# Patient Record
Sex: Male | Born: 1945 | Race: White | Hispanic: No | Marital: Single | Smoking: Former smoker
Health system: Southern US, Community
[De-identification: ages and names within clinical notes are randomized; demographics above are authoritative.]

## PROBLEM LIST (undated history)

## (undated) DIAGNOSIS — J189 Pneumonia, unspecified organism: Secondary | ICD-10-CM

## (undated) DIAGNOSIS — C787 Secondary malignant neoplasm of liver and intrahepatic bile duct: Secondary | ICD-10-CM

## (undated) DIAGNOSIS — I639 Cerebral infarction, unspecified: Secondary | ICD-10-CM

## (undated) DIAGNOSIS — Z950 Presence of cardiac pacemaker: Secondary | ICD-10-CM

## (undated) DIAGNOSIS — C189 Malignant neoplasm of colon, unspecified: Secondary | ICD-10-CM

## (undated) DIAGNOSIS — I10 Essential (primary) hypertension: Secondary | ICD-10-CM

## (undated) HISTORY — PX: COLON SURGERY: SHX602

## (undated) HISTORY — PX: INSERT / REPLACE / REMOVE PACEMAKER: SUR710

---

## 2010-08-03 ENCOUNTER — Inpatient Hospital Stay (HOSPITAL_COMMUNITY)
Admission: EM | Admit: 2010-08-03 | Discharge: 2010-08-05 | DRG: 062 | Disposition: A | Discharge status: Home / Self Care | Payer: Self-pay | Attending: Neurology | Admitting: Neurology

## 2010-08-03 ENCOUNTER — Emergency Department (HOSPITAL_COMMUNITY): Payer: Self-pay

## 2010-08-03 DIAGNOSIS — Z7982 Long term (current) use of aspirin: Secondary | ICD-10-CM

## 2010-08-03 DIAGNOSIS — C189 Malignant neoplasm of colon, unspecified: Secondary | ICD-10-CM | POA: Diagnosis present

## 2010-08-03 DIAGNOSIS — I10 Essential (primary) hypertension: Secondary | ICD-10-CM | POA: Diagnosis present

## 2010-08-03 DIAGNOSIS — C787 Secondary malignant neoplasm of liver and intrahepatic bile duct: Secondary | ICD-10-CM | POA: Diagnosis present

## 2010-08-03 DIAGNOSIS — Z79899 Other long term (current) drug therapy: Secondary | ICD-10-CM

## 2010-08-03 DIAGNOSIS — R4701 Aphasia: Secondary | ICD-10-CM | POA: Diagnosis present

## 2010-08-03 DIAGNOSIS — R1310 Dysphagia, unspecified: Secondary | ICD-10-CM | POA: Diagnosis present

## 2010-08-03 DIAGNOSIS — Z950 Presence of cardiac pacemaker: Secondary | ICD-10-CM

## 2010-08-03 DIAGNOSIS — I6340 Cerebral infarction due to embolism of unspecified cerebral artery: Principal | ICD-10-CM | POA: Diagnosis present

## 2010-08-03 DIAGNOSIS — E785 Hyperlipidemia, unspecified: Secondary | ICD-10-CM | POA: Diagnosis present

## 2010-08-03 DIAGNOSIS — R471 Dysarthria and anarthria: Secondary | ICD-10-CM | POA: Diagnosis present

## 2010-08-03 DIAGNOSIS — Z933 Colostomy status: Secondary | ICD-10-CM

## 2010-08-03 HISTORY — DX: Essential (primary) hypertension: I10

## 2010-08-03 HISTORY — DX: Malignant neoplasm of colon, unspecified: C18.9

## 2010-08-03 HISTORY — DX: Presence of cardiac pacemaker: Z95.0

## 2010-08-03 LAB — COMPREHENSIVE METABOLIC PANEL
AST: 29 U/L (ref 0–37)
BUN: 34 mg/dL — ABNORMAL HIGH (ref 6–23)
CO2: 24 mEq/L (ref 19–32)
Calcium: 9 mg/dL (ref 8.4–10.5)
Creatinine, Ser: 1.94 mg/dL — ABNORMAL HIGH (ref 0.50–1.35)
GFR calc non Af Amer: 35 mL/min — ABNORMAL LOW (ref >60)

## 2010-08-03 LAB — CBC
HCT: 35.2 % — ABNORMAL LOW (ref 39.0–52.0)
Hemoglobin: 11.6 g/dL — ABNORMAL LOW (ref 13.0–17.0)
MCV: 84.8 fL (ref 78.0–100.0)
RBC: 4.15 MIL/uL — ABNORMAL LOW (ref 4.22–5.81)
WBC: 8.3 10*3/uL (ref 4.0–10.5)

## 2010-08-03 LAB — TROPONIN I: Troponin I: 0.36 ng/mL (ref <0.30)

## 2010-08-03 LAB — DIFFERENTIAL
Eosinophils Relative: 0 % (ref 0–5)
Lymphocytes Relative: 9 % — ABNORMAL LOW (ref 12–46)
Monocytes Relative: 18 % — ABNORMAL HIGH (ref 3–12)
Neutrophils Relative %: 73 % (ref 43–77)

## 2010-08-03 LAB — APTT: aPTT: 25 seconds (ref 24–37)

## 2010-08-03 LAB — PROTIME-INR: INR: 1.16 (ref 0.00–1.49)

## 2010-08-03 LAB — POCT I-STAT, CHEM 8
BUN: 34 mg/dL — ABNORMAL HIGH (ref 6–23)
Calcium, Ion: 1.04 mmol/L — ABNORMAL LOW (ref 1.12–1.32)
Chloride: 111 mEq/L (ref 96–112)
Glucose, Bld: 106 mg/dL — ABNORMAL HIGH (ref 70–99)

## 2010-08-03 LAB — CK TOTAL AND CKMB (NOT AT ARMC)
Relative Index: 2.9 — ABNORMAL HIGH (ref 0.0–2.5)
Total CK: 102 U/L (ref 7–232)

## 2010-08-04 ENCOUNTER — Inpatient Hospital Stay (HOSPITAL_COMMUNITY): Payer: Self-pay

## 2010-08-04 ENCOUNTER — Encounter (HOSPITAL_COMMUNITY): Payer: Self-pay | Admitting: Radiology

## 2010-08-04 LAB — CBC
HCT: 34 % — ABNORMAL LOW (ref 39.0–52.0)
Hemoglobin: 11.3 g/dL — ABNORMAL LOW (ref 13.0–17.0)
MCH: 28.2 pg (ref 26.0–34.0)
MCHC: 33.2 g/dL (ref 30.0–36.0)
MCV: 84.8 fL (ref 78.0–100.0)
RDW: 21.9 % — ABNORMAL HIGH (ref 11.5–15.5)

## 2010-08-04 LAB — BASIC METABOLIC PANEL
BUN: 37 mg/dL — ABNORMAL HIGH (ref 6–23)
BUN: 28 mg/dL — ABNORMAL HIGH (ref 6–23)
CO2: 21 mEq/L (ref 19–32)
Chloride: 107 mEq/L (ref 96–112)
Creatinine, Ser: 1.18 mg/dL (ref 0.50–1.35)
GFR calc Af Amer: >60 mL/min (ref >60)
GFR calc non Af Amer: 42 mL/min — ABNORMAL LOW (ref >60)
GFR calc non Af Amer: >60 mL/min (ref >60)
Glucose, Bld: 106 mg/dL — ABNORMAL HIGH (ref 70–99)
Glucose, Bld: 98 mg/dL (ref 70–99)
Potassium: 4.3 mEq/L (ref 3.5–5.1)
Potassium: 4.1 mEq/L (ref 3.5–5.1)
Sodium: 141 mEq/L (ref 135–145)

## 2010-08-04 LAB — LIPID PANEL
HDL: 64 mg/dL (ref >39)
LDL Cholesterol: 139 mg/dL — ABNORMAL HIGH (ref 0–99)
Total CHOL/HDL Ratio: 3.6 RATIO
Triglycerides: 152 mg/dL — ABNORMAL HIGH (ref <150)

## 2010-08-04 LAB — MRSA PCR SCREENING: MRSA by PCR: NEGATIVE

## 2010-08-04 LAB — URINALYSIS, ROUTINE W REFLEX MICROSCOPIC
Hgb urine dipstick: NEGATIVE
Leukocytes, UA: NEGATIVE
Nitrite: NEGATIVE
Protein, ur: 30 mg/dL — AB
Specific Gravity, Urine: 1.028 (ref 1.005–1.030)
Urobilinogen, UA: 0.2 mg/dL (ref 0.0–1.0)

## 2010-08-04 LAB — HEMOGLOBIN A1C: Hgb A1c MFr Bld: 6 % — ABNORMAL HIGH (ref <5.7)

## 2010-08-04 LAB — URINE MICROSCOPIC-ADD ON

## 2010-08-04 MED ORDER — IOHEXOL 350 MG/ML SOLN
50.0000 mL | Freq: Once | INTRAVENOUS | Status: AC | PRN
  Administered 2010-08-04: 50 mL via INTRAVENOUS

## 2010-08-08 ENCOUNTER — Ambulatory Visit (HOSPITAL_COMMUNITY)
Admission: RE | Admit: 2010-08-08 | Discharge: 2010-08-08 | Disposition: A | Discharge status: Home / Self Care | Payer: Self-pay | Source: Ambulatory Visit | Attending: Cardiology | Admitting: Cardiology

## 2010-08-08 DIAGNOSIS — Z01810 Encounter for preprocedural cardiovascular examination: Secondary | ICD-10-CM | POA: Insufficient documentation

## 2010-08-08 DIAGNOSIS — I252 Old myocardial infarction: Secondary | ICD-10-CM | POA: Insufficient documentation

## 2010-08-08 DIAGNOSIS — Z8673 Personal history of transient ischemic attack (TIA), and cerebral infarction without residual deficits: Secondary | ICD-10-CM | POA: Insufficient documentation

## 2010-08-08 DIAGNOSIS — I6789 Other cerebrovascular disease: Secondary | ICD-10-CM

## 2010-08-17 NOTE — H&P (Signed)
NAMEAABAN, GRIEP NO.:  [id number]  MEDICAL RECORD NO.:  [id number]  LOCATION:  3109                         FACILITY:  [facility]  PHYSICIAN:  Marlan Palau, M.D.  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  This patient is actively undergoing chemotherapy.  The patient is receiving fluorouracil and is also on Avastin.  The patient was with his family this evening at around 7:30 p.m. began noting onset of difficulty with speaking, some right facial droop.  The patient was brought to [facility] via EMS.  The patient has had no numbness or weakness of the extremities.  The patient comes to the ER today for evaluation.  NIH stroke scale score is 4.  The patient does have a history of hypertension.  PAST MEDICAL HISTORY:  Significant for; 1. Left brain stroke.  CT negative. 2. Hypertension. 3. Colon cancer.  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  The     patient is on chemotherapy. 4. Pacemaker placement. 5. TURP for benign prostate enlargement. 6. Colostomy 4 months ago.  ALLERGIES:  The patient has no known allergies.  MEDICATIONS: 1. Aspirin 81 mg daily. 2. Lasix 20 mg daily. 3. Carvedilol 6.25 mg twice daily. 4. Lisinopril 2.5 mg daily. 5. K-Dur 10 mEq daily.  HABITS:  The patient does not smoke or drink.  SOCIAL HISTORY:  It is notable that the patient is married, lives in the [place], [place] area, is on disability, has no children.  FAMILY MEDICAL HISTORY:  Mother died with diabetes and hypertension. Father is still living, has a pacemaker.  The patient has 3 brothers and 2 sisters, 1 brother has "blood clots," 1 sister has had cancer.  REVIEW OF SYSTEMS:  Notable for no recent fevers or chills.   The patient does note slight headache today.  He has had some speech issues and slight shortness of breath.  Denies problems controlling the bladder, has had some nausea and vomiting this morning.  The patient does have a cardiac pacemaker.  PHYSICAL EXAMINATION:  VITAL SIGNS:  Blood pressure is 143/76, heart rate 100, and respiratory rate 16. GENERAL:  This patient is a fairly well-developed white male who is alert, cooperative at the time of examination. HEENT:  Head is atraumatic.  Eyes: Pupils equal, round, and reactive to light. NECK:  Supple.  No carotid bruits noted. RESPIRATORY:  Clear. CARDIOVASCULAR:  Regular rate and rhythm.  No obvious murmurs or rubs noted. EXTREMITIES:  Without significant edema. ABDOMEN:  Colostomy bag in place.  Positive bowel sounds. NEUROLOGIC:  Cranial nerves as above.  Facial symmetry is not present with depression in the right nasolabial fold, asymmetric smile. Extraocular movements are full.  Visual fields are full.  Speech is severely dysarthric.  The patient perseverates slight aphasia.  He is able to understand speech quite well, however, follow commands easily. The patient has good motor testing on all fours.  Good strength.  Good pinprick to soft touch, vibratory sensation throughout.  The patient has good finger-to-nose, finger-to-toe, and heel-to-shin.  Gait was not tested.  Deep tendon reflexes symmetric.  Toes neutral bilaterally.  No evidence of extinction noted.  Laboratory values notable for sodium 140, potassium 4.3, chloride of 111, CO2 of 24, BUN is 34, creatinine 2.1, white count of 8.3, hemoglobin 11.6, hematocrit 35.2, and platelets of 171.  The INR is 1.16 and troponin-I 0.36.  CT of the head is unremarkable.  IMPRESSION: 1. Left brain stroke. 2. Colon cancer stage IV metastatic to the liver. 3. Hypertension.  This patient was given t-PA full-dose.  The patient will be transferred 3100 intensive care unit.  We will  check blood work in the morning.  If the BUN and creatinine comes down, we will consider CT angiogram of the head and neck.  The patient has a paced cardiac pacemaker placed, cannot have an MRI, 2-D echocardiogram, but it is pending.  The patient is on Avastin which can increase risk for stroke.  We will follow patient's clinical course while in-house.     Marlan Palau, M.D.     CKW/MEDQ  D:  08/03/2010  T:  08/04/2010  Job:  [id number]  cc:   Feliciana Rossetti, MD  Electronically Signed by Thana Farr M.D. on 08/17/2010 08:23:25 AM

## 2010-08-26 NOTE — Discharge Summary (Signed)
Kevin, Hanson               ACCOUNT NO.:  [id number]  MEDICAL RECORD NO.:  [id number]  LOCATION:  3109                         FACILITY:  [facility]  PHYSICIAN:  Pramod P. Pearlean Brownie, MD    DATE OF BIRTH:  [birth year]  DATE OF ADMISSION:  08/03/2010 DATE OF DISCHARGE:  08/05/2010                              DISCHARGE SUMMARY   DIAGNOSES AT TIME OF DISCHARGE: 1. Left posterior frontal and temporal lobe infarct, status post full-     dose IV t-PA, embolic source, question cardiac source versus     hypercoagulable from cancer. 2. Dysphagia secondary to current stroke. 3. Hypertension. 4. Colon cancer with metastatic disease to the liver, stage IV.  The     patient currently undergoing chemotherapy. 5. Pacemaker placement. 6. Transurethral prostatectomy for benign prostatic enlargement. 7. Colostomy 4 months ago. 8. Dyslipidemia.  MEDICINES AT TIME OF DISCHARGE: 1. Aspirin 325 mg a day. 2. Coreg 6.25 mg b.i.d. 3. Chemotherapy every 2 weeks by [facility]. 4. Furosemide 20 mg a day. 5. Lisinopril 2.5 mg a day. 6. Multivitamin one a day. 7. Potassium chloride 10 mEq a day. 8. Food thickener for honey-thick liquids.  STUDIES PERFORMED: 1. CT of the brain on admission shows mild atrophy and small vessel     disease.  No acute abnormality. 2. CT angio of the neck shows mild atherosclerotic changes of the     aortic arch and carotid bifurcations bilaterally.  Soft tissue     plaque along the proximal left internal carotid artery with a tiny     ulceration, but no significant stenosis.  Moderate cervical     spondylosis. 3. CT angio of the head shows new left posterior frontal and temporal     lobe infarct.  Remote right parietal lobe infarct.  Stable white     matter disease.  Minimal atherosclerotic calcification in the     cavernous arteries without significant stenosis, otherwise     unremarkable. 4. A 2-D echocardiogram shows EF of 20-25%.  Echodense mass seen in    the apex.  Unclear if it is hypertrophy or prominent moderator     band.  Cannot exclude thrombus.  No source of embolus. 5. Carotid Doppler shows no ICA stenosis. 6. EKG showed ventricular paced complexes, no further rhythm analysis     attempted due to paced rhythm, left bundle-branch block.     Reconstructed pacer spikes in leads II, aVR, and D1.  LABORATORY STUDIES:  Chemistry with BUN 28, creatinine 1.18, calcium 8.1, otherwise normal.  Hemoglobin A1c 6.0.  Troponin-I 1.06. Cholesterol 233, triglycerides 152, HDL of 64 and LDL of 139.  CK 101, CK-MB 3.9.  CBC with hemoglobin 11.3, hematocrit 34.0, white blood cells 5.6, platelets 154.  MRSA screening negative.  Urinalysis with 3-6 white blood cell, 0-2 red blood cells.  Alkaline phosphatase 86, SGOT 29, SGPT 28, total protein 7.8, albumin 3.4.  HISTORY OF PRESENT ILLNESS:  [name] is a 65 year old right- handed Caucasian male with history of colon cancer with metastatic disease to the liver.  The patient is actively undergoing chemotherapy with 5-FU pump running.  The patient is also on Avastin.  The patient was with his family the evening of admission around 7:30 p.m. when he noted onset of difficulty with speach and right facial droop.  The patient was brought to [facility] ER via EMS.  He has no numbness or weakness of the extremities.  NIH stroke scale was 4.  The patient does have a history of hypertension.  Treatment options were discussed with the patient and family.  It was decided the patient was a TPA candidate. The patient was given full-dose IV t-PA and admitted to the Neuro ICU.  HOSPITAL COURSE:  The patient tolerated t-PA without difficulty.  CT  performed 24 hours after t-PA showed no hemorrhage.  The patient was placed on aspirin 325 mg daily for secondary stroke prevention.  It is felt his stroke is embolic in nature; source is uncertain.  A 2-D echocardiogram showed possible LV echo density, for  which thrombus could not be ruled out.  The patient is also hypercoagulable secondary to his colon cancer and may also be the etiology.  I have recommended a followup TEE on Monday August 08, 2010, as an outpatient with [facility] to further evaluate LV density.  The patient is followed by [facility], [facility] on [address].  Spoke to their on-call person, day of discharge.  They do not perform TEEs in [place] and would be okay with TEE being performed in [place] on Monday.  The patient has multiple risk factors including colon cancer and possible LV mass.  The patient with LDL of 139.  Liver function tests are normal, but due to liver mets, we will hold statin at this time and asked for follow up by primary care physician and/or oncologist for appropriateness of statin.  The patient was evaluated by PT, OT and Speech Therapy.  PT, OT felt he is safe for return home with home health therapies following up there. Speech Therapy were also follow up as they placed him on the dysphasia III honey thick liquid diet in hospital secondary to dysphasia from the stroke.  CONDITION AT TIME OF DISCHARGE:  The patient was alert and oriented x3. Speech dysarthric with nonfluent expressive aphasia.  His right face is weak.  He has no drift in his extremities.  He has no focal weakness with strength testing in his lower extremities.  He denies sensory loss. Plantars are down bilaterally.  Gait is unsteady.  DISCHARGE PLAN: 1. Discharge home with family. 2. Aspirin 325 mg for secondary stroke prevention. 3. Outpatient TEE on Monday at [facility] by [place] at 2:30 p.m.,     please arrive at 1:30 p.m. to short-stay. 4. No statin secondary to liver mets in the setting of normal LFTs.     We will ask oncologist and/or primary care physician to follow up     for statin appropriateness.  Statin is recommended for secondary     stroke prevention for LDL is greater than  100.  FOLLOWUP: 1. As per Cancer Center as previously scheduled. 2. Follow up with Dr. Delia Heady in his office in 2 months. 3. Follow up with primary care physician within 1 month.     Annie Main, N.P.   ______________________________ Sunny Schlein. Pearlean Brownie, MD    SB/MEDQ  D:  08/05/2010  T:  08/06/2010  Job:  [id number]  cc:   [facility] Georgeanna Lea, MD Feliciana Rossetti, MD  Electronically Signed by Annie Main N.P. on 08/15/2010 03:26:04 PM Electronically Signed by Delia Heady MD on 08/26/2010 11:14:39  AM

## 2012-01-02 ENCOUNTER — Other Ambulatory Visit: Payer: Self-pay | Admitting: Oncology

## 2012-01-02 DIAGNOSIS — C189 Malignant neoplasm of colon, unspecified: Secondary | ICD-10-CM

## 2012-01-08 ENCOUNTER — Ambulatory Visit (HOSPITAL_COMMUNITY)
Admission: RE | Admit: 2012-01-08 | Discharge: 2012-01-08 | Disposition: A | Discharge status: Home / Self Care | Payer: PRIVATE HEALTH INSURANCE | Source: Ambulatory Visit | Attending: Oncology | Admitting: Oncology

## 2012-01-08 DIAGNOSIS — C189 Malignant neoplasm of colon, unspecified: Secondary | ICD-10-CM | POA: Insufficient documentation

## 2012-01-08 DIAGNOSIS — K7689 Other specified diseases of liver: Secondary | ICD-10-CM | POA: Insufficient documentation

## 2012-01-08 DIAGNOSIS — Z933 Colostomy status: Secondary | ICD-10-CM | POA: Insufficient documentation

## 2012-01-08 DIAGNOSIS — R918 Other nonspecific abnormal finding of lung field: Secondary | ICD-10-CM | POA: Insufficient documentation

## 2012-01-08 LAB — GLUCOSE, CAPILLARY: Glucose-Capillary: 96 mg/dL (ref 70–99)

## 2012-01-08 MED ORDER — FLUDEOXYGLUCOSE F - 18 (FDG) INJECTION
20.0000 | Freq: Once | INTRAVENOUS | Status: AC | PRN
  Administered 2012-01-08: 20 via INTRAVENOUS

## 2012-01-15 ENCOUNTER — Other Ambulatory Visit: Payer: Self-pay | Admitting: Hematology and Oncology

## 2012-01-15 DIAGNOSIS — C787 Secondary malignant neoplasm of liver and intrahepatic bile duct: Secondary | ICD-10-CM

## 2012-01-17 ENCOUNTER — Ambulatory Visit
Admission: RE | Admit: 2012-01-17 | Discharge: 2012-01-17 | Disposition: A | Discharge status: Home / Self Care | Payer: PRIVATE HEALTH INSURANCE | Source: Ambulatory Visit | Attending: Hematology and Oncology | Admitting: Hematology and Oncology

## 2012-01-17 ENCOUNTER — Other Ambulatory Visit: Payer: Self-pay | Admitting: Emergency Medicine

## 2012-01-17 VITALS — BP 135/82 | HR 78 | Temp 97.8°F | Resp 16 | Ht 66.0 in | Wt 175.0 lb

## 2012-01-17 DIAGNOSIS — C189 Malignant neoplasm of colon, unspecified: Secondary | ICD-10-CM

## 2012-01-17 DIAGNOSIS — C787 Secondary malignant neoplasm of liver and intrahepatic bile duct: Secondary | ICD-10-CM

## 2012-01-17 HISTORY — DX: Secondary malignant neoplasm of liver and intrahepatic bile duct: C78.7

## 2012-01-17 HISTORY — DX: Cerebral infarction, unspecified: I63.9

## 2012-01-17 HISTORY — DX: Pneumonia, unspecified organism: J18.9

## 2012-01-25 ENCOUNTER — Telehealth: Payer: Self-pay | Admitting: Emergency Medicine

## 2012-01-25 NOTE — Telephone Encounter (Signed)
S/W PT AND FIANCEE ABOUT CT-A - DR WATTS SAID LOOKS GOOD FOR Y-90 PROCEDURE.  ALSO THEY WILL CONTACT ME AS SOON AS THEY RECEIVE NEW INS. CARD/INFO. SO WE CAN SUBMIT FOR THE PROCEDURE.    02-01-12- CALLED PT TO MAKE HIM AWARE THAT WE ARE FAXING INFO FOR Y-90 PROCEDURE.  WE WERE ABLE TO GO ONLINE TO GET HIS NEW BC[employer] ID# SINCE THEY HAVE NOT RECEIVED INS. CARD YET.

## 2012-02-02 ENCOUNTER — Other Ambulatory Visit (HOSPITAL_COMMUNITY): Payer: Self-pay | Admitting: Interventional Radiology

## 2012-02-02 DIAGNOSIS — C787 Secondary malignant neoplasm of liver and intrahepatic bile duct: Secondary | ICD-10-CM

## 2012-02-02 DIAGNOSIS — C189 Malignant neoplasm of colon, unspecified: Secondary | ICD-10-CM

## 2012-02-07 ENCOUNTER — Other Ambulatory Visit (HOSPITAL_COMMUNITY): Payer: Self-pay | Admitting: Interventional Radiology

## 2012-02-07 DIAGNOSIS — C787 Secondary malignant neoplasm of liver and intrahepatic bile duct: Secondary | ICD-10-CM

## 2012-02-07 DIAGNOSIS — C189 Malignant neoplasm of colon, unspecified: Secondary | ICD-10-CM

## 2012-02-08 ENCOUNTER — Other Ambulatory Visit (HOSPITAL_COMMUNITY): Payer: Self-pay | Admitting: Interventional Radiology

## 2012-02-08 DIAGNOSIS — C189 Malignant neoplasm of colon, unspecified: Secondary | ICD-10-CM

## 2012-02-08 DIAGNOSIS — C787 Secondary malignant neoplasm of liver and intrahepatic bile duct: Secondary | ICD-10-CM

## 2012-02-09 ENCOUNTER — Other Ambulatory Visit: Payer: Self-pay | Admitting: Oncology

## 2012-02-26 ENCOUNTER — Other Ambulatory Visit: Payer: Self-pay | Admitting: Oncology

## 2012-03-01 ENCOUNTER — Other Ambulatory Visit: Payer: Self-pay | Admitting: Radiology

## 2012-03-05 ENCOUNTER — Encounter (HOSPITAL_COMMUNITY): Payer: Self-pay | Admitting: Pharmacy Technician

## 2012-03-06 ENCOUNTER — Ambulatory Visit (HOSPITAL_COMMUNITY)
Admission: RE | Admit: 2012-03-06 | Discharge: 2012-03-06 | Disposition: A | Discharge status: Home / Self Care | Payer: BC Managed Care – PPO | Source: Ambulatory Visit | Attending: Interventional Radiology | Admitting: Interventional Radiology

## 2012-03-06 ENCOUNTER — Other Ambulatory Visit (HOSPITAL_COMMUNITY): Payer: Self-pay | Admitting: Interventional Radiology

## 2012-03-06 ENCOUNTER — Other Ambulatory Visit (HOSPITAL_COMMUNITY): Payer: PRIVATE HEALTH INSURANCE

## 2012-03-06 ENCOUNTER — Encounter (HOSPITAL_COMMUNITY)
Admission: RE | Admit: 2012-03-06 | Discharge: 2012-03-06 | Disposition: A | Discharge status: Home / Self Care | Payer: BC Managed Care – PPO | Source: Ambulatory Visit | Attending: Interventional Radiology | Admitting: Interventional Radiology

## 2012-03-06 ENCOUNTER — Encounter (HOSPITAL_COMMUNITY): Payer: PRIVATE HEALTH INSURANCE

## 2012-03-06 ENCOUNTER — Inpatient Hospital Stay (HOSPITAL_COMMUNITY): Admission: RE | Admit: 2012-03-06 | Payer: PRIVATE HEALTH INSURANCE | Source: Ambulatory Visit

## 2012-03-06 ENCOUNTER — Encounter (HOSPITAL_COMMUNITY): Payer: Self-pay

## 2012-03-06 VITALS — BP 140/80 | HR 88 | Temp 97.9°F | Resp 18 | Ht 66.0 in | Wt 177.0 lb

## 2012-03-06 DIAGNOSIS — Z8673 Personal history of transient ischemic attack (TIA), and cerebral infarction without residual deficits: Secondary | ICD-10-CM | POA: Insufficient documentation

## 2012-03-06 DIAGNOSIS — C189 Malignant neoplasm of colon, unspecified: Secondary | ICD-10-CM

## 2012-03-06 DIAGNOSIS — Z79899 Other long term (current) drug therapy: Secondary | ICD-10-CM | POA: Insufficient documentation

## 2012-03-06 DIAGNOSIS — Z86718 Personal history of other venous thrombosis and embolism: Secondary | ICD-10-CM | POA: Insufficient documentation

## 2012-03-06 DIAGNOSIS — C787 Secondary malignant neoplasm of liver and intrahepatic bile duct: Secondary | ICD-10-CM

## 2012-03-06 DIAGNOSIS — Z950 Presence of cardiac pacemaker: Secondary | ICD-10-CM | POA: Insufficient documentation

## 2012-03-06 DIAGNOSIS — Z7901 Long term (current) use of anticoagulants: Secondary | ICD-10-CM | POA: Insufficient documentation

## 2012-03-06 DIAGNOSIS — I10 Essential (primary) hypertension: Secondary | ICD-10-CM | POA: Insufficient documentation

## 2012-03-06 LAB — CBC WITH DIFFERENTIAL/PLATELET
Eosinophils Absolute: 0.3 10*3/uL (ref 0.0–0.7)
Eosinophils Relative: 5 % (ref 0–5)
HCT: 42.4 % (ref 39.0–52.0)
Hemoglobin: 14.8 g/dL (ref 13.0–17.0)
Lymphs Abs: 1.2 10*3/uL (ref 0.7–4.0)
MCH: 29.5 pg (ref 26.0–34.0)
MCV: 84.5 fL (ref 78.0–100.0)
Monocytes Absolute: 0.8 10*3/uL (ref 0.1–1.0)
Monocytes Relative: 13 % — ABNORMAL HIGH (ref 3–12)
Platelets: 228 10*3/uL (ref 150–400)
RBC: 5.02 MIL/uL (ref 4.22–5.81)

## 2012-03-06 LAB — COMPREHENSIVE METABOLIC PANEL
Alkaline Phosphatase: 87 U/L (ref 39–117)
BUN: 27 mg/dL — ABNORMAL HIGH (ref 6–23)
CO2: 24 mEq/L (ref 19–32)
Chloride: 101 mEq/L (ref 96–112)
Creatinine, Ser: 1.49 mg/dL — ABNORMAL HIGH (ref 0.50–1.35)
GFR calc non Af Amer: 48 mL/min — ABNORMAL LOW (ref >90)
Glucose, Bld: 103 mg/dL — ABNORMAL HIGH (ref 70–99)
Potassium: 4.5 mEq/L (ref 3.5–5.1)
Total Bilirubin: 0.4 mg/dL (ref 0.3–1.2)

## 2012-03-06 MED ORDER — SODIUM CHLORIDE 0.9 % IV SOLN
INTRAVENOUS | Status: DC
  Administered 2012-03-06 (×2): via INTRAVENOUS

## 2012-03-06 MED ORDER — HEPARIN SOD (PORK) LOCK FLUSH 100 UNIT/ML IV SOLN
500.0000 [IU] | Freq: Once | INTRAVENOUS | Status: AC
  Administered 2012-03-06: 500 [IU] via INTRAVENOUS

## 2012-03-06 MED ORDER — TECHNETIUM TO 99M ALBUMIN AGGREGATED
4.8000 | Freq: Once | INTRAVENOUS | Status: AC | PRN
  Administered 2012-03-06: 4.8 via INTRAVENOUS

## 2012-03-06 MED ORDER — IOHEXOL 300 MG/ML  SOLN
100.0000 mL | Freq: Once | INTRAMUSCULAR | Status: AC | PRN
  Administered 2012-03-06: 135 mL via INTRA_ARTERIAL

## 2012-03-06 MED ORDER — HEPARIN SOD (PORK) LOCK FLUSH 100 UNIT/ML IV SOLN
INTRAVENOUS | Status: AC
  Filled 2012-03-06: qty 5

## 2012-03-06 MED ORDER — MIDAZOLAM HCL 2 MG/2ML IJ SOLN
INTRAMUSCULAR | Status: AC
  Filled 2012-03-06: qty 6

## 2012-03-06 MED ORDER — MIDAZOLAM HCL 2 MG/2ML IJ SOLN
INTRAMUSCULAR | Status: AC | PRN
  Administered 2012-03-06 (×2): 1 mg via INTRAVENOUS
  Administered 2012-03-06: 2 mg via INTRAVENOUS

## 2012-03-06 MED ORDER — LIDOCAINE HCL 1 % IJ SOLN
INTRAMUSCULAR | Status: AC
  Filled 2012-03-06: qty 20

## 2012-03-06 MED ORDER — ONDANSETRON HCL 4 MG/2ML IJ SOLN: 4.0000 mg | Freq: Once | INTRAMUSCULAR | Status: DC

## 2012-03-06 MED ORDER — ONDANSETRON HCL 4 MG/2ML IJ SOLN
INTRAMUSCULAR | Status: AC
  Filled 2012-03-06: qty 2

## 2012-03-06 MED ORDER — ONDANSETRON HCL 4 MG/2ML IJ SOLN
4.0000 mg | Freq: Once | INTRAMUSCULAR | Status: AC
  Administered 2012-03-06: 4 mg via INTRAVENOUS

## 2012-03-06 MED ORDER — FENTANYL CITRATE 0.05 MG/ML IJ SOLN
INTRAMUSCULAR | Status: AC
  Filled 2012-03-06: qty 6

## 2012-03-06 MED ORDER — FENTANYL CITRATE 0.05 MG/ML IJ SOLN
INTRAMUSCULAR | Status: AC | PRN
  Administered 2012-03-06: 50 ug via INTRAVENOUS
  Administered 2012-03-06: 100 ug via INTRAVENOUS
  Administered 2012-03-06 (×2): 50 ug via INTRAVENOUS

## 2012-03-06 MED ORDER — NITROGLYCERIN 1 MG/10 ML FOR IR/CATH LAB
300.0000 ug | INTRA_ARTERIAL | Status: AC
  Administered 2012-03-06: 300 ug via INTRA_ARTERIAL

## 2012-03-06 MED ORDER — ONDANSETRON HCL 4 MG/2ML IJ SOLN
INTRAMUSCULAR | Status: AC
  Administered 2012-03-06: 4 mg via INTRAVENOUS
  Filled 2012-03-06: qty 2

## 2012-03-06 NOTE — Procedures (Signed)
Post Stage 1 Y-90 with embolization of the GDA and right gastric arteries.  No immediate complications.  Keep right leg straight for 4 hrs.

## 2012-03-06 NOTE — Progress Notes (Signed)
Patient denies nausea now. Patient kept saltine crackers and a ginger ale down. Patient ambulated to the restroom and voided without and difficulty. Right groin dressing is clean, dry and intact. Per Dr. Grace Isaac, OK to discharge.

## 2012-03-06 NOTE — ED Notes (Signed)
Pt transported to Nuc Med for post procedure imaging on bed with RN/monitor.

## 2012-03-06 NOTE — Progress Notes (Signed)
Pt has only had few ice chips,sprite and one saltine cracker . HOB 30 degrees. Pt became nauseated and vomited approx 300 cc bile colored liquid. Placed call to radiology .

## 2012-03-06 NOTE — H&P (Signed)
Chief Complaint: "I'm here for my pre Y-90" HPI: Kevin Hanson is an 68 y.o. male with hx of stage IV colon cancer with mets to the liver. He has been seen by Dr. Grace Isaac in consult to discuss Y-90 treatment. Please refer to IR Rad eval consult note in PACS for details. He is scheduled today for pre Y-90. He takes Coumadin for a hx of left ventricular thrombus, but stopped it several days ago and has been on a Lovenox bridge, which he took until the night of 2/3. He otherwise feels fine, no recent illnesses.  Past Medical History:  Past Medical History  Diagnosis Date  . Hypertension   . Colon cancer   . Pacemaker   . Liver metastases   . Pneumonia   . Stroke     Past Surgical History:  Past Surgical History  Procedure Date  . Colon surgery   . Insert / replace / remove pacemaker     Family History: History reviewed. No pertinent family history.  Social History:  reports that he quit smoking about 7 years ago. His smoking use included Pipe. He started smoking about 44 years ago. He has never used smokeless tobacco. He reports that he does not drink alcohol. His drug history not on file.  Allergies: No Known Allergies  Medications: carvedilol (COREG) 3.125 MG tablet (Taking) Sig - Route: Take 3.125 mg by mouth 2 (two) times daily with a meal. - Oral Class: Historical Med enoxaparin (LOVENOX) 120 MG/0.8ML injection (Taking) Sig - Route: Inject 120 mg into the skin daily. Will not take 03/05/12 or 03/06/12 but will start again on 03/07/12 - Subcutaneous Class: Historical Med furosemide (LASIX) 20 MG tablet (Taking) Sig - Route: Take 20 mg by mouth daily before breakfast. - Oral Class: Historical Med lisinopril (PRINIVIL,ZESTRIL) 5 MG tablet (Taking) Sig - Route: Take 5 mg by mouth daily before breakfast. - Oral Class: Historical Med magnesium oxide (MAG-OX) 400 MG tablet (Taking) Sig - Route: Take 400 mg by mouth daily. - Oral Class: Historical Med Multiple Vitamin (MULTIVITAMIN WITH  MINERALS) TABS (Taking) Sig - Route: Take 1 tablet by mouth daily. - Oral Class: Historical Med potassium chloride (K-DUR,KLOR-CON) 10 MEQ tablet (Taking) Sig - Route: Take 10 mEq by mouth daily. - Oral Class: Historical Med warfarin (COUMADIN) 5 MG tablet (Taking) Sig - Route: Take 5 mg by mouth daily before breakfast.    Please HPI for pertinent positives, otherwise complete 10 system ROS negative.  Physical Exam: Blood pressure 129/98, pulse 94, temperature 97.7 F (36.5 C), temperature source Oral, resp. rate 18, height 5\' 6"  (1.676 m), weight 177 lb (80.287 kg), SpO2 91.00%. Body mass index is 28.57 kg/(m^2).   General Appearance:  Alert, cooperative, no distress, appears stated age  Head:  Normocephalic, without obvious abnormality, atraumatic  ENT: Unremarkable  Neck: Supple, symmetrical, trachea midline, no adenopathy, thyroid: not enlarged, symmetric, no tenderness/mass/nodules  Lungs:   Clear to auscultation bilaterally, no w/r/r, respirations unlabored without use of accessory muscles.  Heart:  Regular rate and rhythm, S1, S2 normal, no murmur, rub or gallop. Carotids 2+ without bruit.  Abdomen:   Soft, non-tender, non distended. (L)abd colostomy.  Extremities: Extremities normal, atraumatic, no cyanosis or edema  Pulses: 2+ and symmetric  Neurologic: Normal affect, no gross deficits.   Results for orders placed during the hospital encounter of 03/06/12 (from the past 48 hour(s))  APTT     Status: Normal   Collection Time   03/06/12  7:00 AM  Component Value Range Comment   aPTT 30  24 - 37 seconds   CBC WITH DIFFERENTIAL     Status: Abnormal   Collection Time   03/06/12  7:00 AM      Component Value Range Comment   WBC 6.3  4.0 - 10.5 K/uL    RBC 5.02  4.22 - 5.81 MIL/uL    Hemoglobin 14.8  13.0 - 17.0 g/dL    HCT 16.1  09.6 - 04.5 %    MCV 84.5  78.0 - 100.0 fL    MCH 29.5  26.0 - 34.0 pg    MCHC 34.9  30.0 - 36.0 g/dL    RDW 40.9  81.1 - 91.4 %    Platelets 228   150 - 400 K/uL    Neutrophils Relative 61  43 - 77 %    Neutro Abs 3.8  1.7 - 7.7 K/uL    Lymphocytes Relative 20  12 - 46 %    Lymphs Abs 1.2  0.7 - 4.0 K/uL    Monocytes Relative 13 (*) 3 - 12 %    Monocytes Absolute 0.8  0.1 - 1.0 K/uL    Eosinophils Relative 5  0 - 5 %    Eosinophils Absolute 0.3  0.0 - 0.7 K/uL    Basophils Relative 1  0 - 1 %    Basophils Absolute 0.1  0.0 - 0.1 K/uL   PROTIME-INR     Status: Normal   Collection Time   03/06/12  7:00 AM      Component Value Range Comment   Prothrombin Time 12.8  11.6 - 15.2 seconds    INR 0.97  0.00 - 1.49   COMPREHENSIVE METABOLIC PANEL     Status: Abnormal   Collection Time   03/06/12  8:20 AM      Component Value Range Comment   Sodium 134 (*) 135 - 145 mEq/L    Potassium 4.5  3.5 - 5.1 mEq/L    Chloride 101  96 - 112 mEq/L    CO2 24  19 - 32 mEq/L    Glucose, Bld 103 (*) 70 - 99 mg/dL    BUN 27 (*) 6 - 23 mg/dL    Creatinine, Ser 7.82 (*) 0.50 - 1.35 mg/dL    Calcium 8.9  8.4 - 95.6 mg/dL    Total Protein 7.3  6.0 - 8.3 g/dL    Albumin 3.2 (*) 3.5 - 5.2 g/dL    AST 39 (*) 0 - 37 U/L    ALT 58 (*) 0 - 53 U/L    Alkaline Phosphatase 87  39 - 117 U/L    Total Bilirubin 0.4  0.3 - 1.2 mg/dL    GFR calc non Af Amer 48 (*) >90 mL/min    GFR calc Af Amer 55 (*) >90 mL/min    No results found.  Assessment/Plan Stage IV colon cancer with mets to liver. For pre Y-90 visceral angiogram today.  Reviewed procedure with pt and family today, including risks, complications, and recovery. Consent signed in chart  Brayton El PA-C 03/06/2012, 9:07 AM

## 2012-03-06 NOTE — Progress Notes (Signed)
Called Dr. Grace Isaac to inform him patient had vomited approximately 300 mls of greenish bile. Orders received.

## 2012-03-06 NOTE — ED Notes (Signed)
5Fr R Fem Artery Sheath removed by Dr. Grace Isaac.  Manual pressure applied X 10 minutes. R groin level 0. 1+ RDP, 3+RPT.

## 2012-03-06 NOTE — ED Notes (Signed)
Gauze tegaderm drsg applied to R Fem Artery puncture.  R groin level 0. 1+RDP.

## 2012-03-07 ENCOUNTER — Encounter (HOSPITAL_COMMUNITY): Payer: Self-pay | Admitting: Pharmacy Technician

## 2012-03-11 ENCOUNTER — Other Ambulatory Visit: Payer: Self-pay | Admitting: Radiology

## 2012-03-13 ENCOUNTER — Other Ambulatory Visit (HOSPITAL_COMMUNITY): Payer: Self-pay | Admitting: Interventional Radiology

## 2012-03-13 ENCOUNTER — Encounter (HOSPITAL_COMMUNITY)
Admission: RE | Admit: 2012-03-13 | Discharge: 2012-03-13 | Disposition: A | Discharge status: Home / Self Care | Payer: BC Managed Care – PPO | Source: Ambulatory Visit | Attending: Interventional Radiology | Admitting: Interventional Radiology

## 2012-03-13 ENCOUNTER — Encounter (HOSPITAL_COMMUNITY): Payer: Self-pay

## 2012-03-13 ENCOUNTER — Other Ambulatory Visit (HOSPITAL_COMMUNITY): Payer: PRIVATE HEALTH INSURANCE

## 2012-03-13 ENCOUNTER — Ambulatory Visit (HOSPITAL_COMMUNITY)
Admission: RE | Admit: 2012-03-13 | Discharge: 2012-03-13 | Disposition: A | Discharge status: Home / Self Care | Payer: BC Managed Care – PPO | Source: Ambulatory Visit | Attending: Interventional Radiology | Admitting: Interventional Radiology

## 2012-03-13 DIAGNOSIS — C787 Secondary malignant neoplasm of liver and intrahepatic bile duct: Secondary | ICD-10-CM

## 2012-03-13 DIAGNOSIS — C189 Malignant neoplasm of colon, unspecified: Secondary | ICD-10-CM

## 2012-03-13 DIAGNOSIS — C801 Malignant (primary) neoplasm, unspecified: Secondary | ICD-10-CM | POA: Insufficient documentation

## 2012-03-13 LAB — CBC
MCH: 29.2 pg (ref 26.0–34.0)
MCV: 83.8 fL (ref 78.0–100.0)
Platelets: 274 10*3/uL (ref 150–400)
RDW: 15.3 % (ref 11.5–15.5)

## 2012-03-13 LAB — COMPREHENSIVE METABOLIC PANEL
AST: 29 U/L (ref 0–37)
Albumin: 3 g/dL — ABNORMAL LOW (ref 3.5–5.2)
CO2: 21 mEq/L (ref 19–32)
Calcium: 8.8 mg/dL (ref 8.4–10.5)
Creatinine, Ser: 1.54 mg/dL — ABNORMAL HIGH (ref 0.50–1.35)
GFR calc non Af Amer: 46 mL/min — ABNORMAL LOW (ref >90)

## 2012-03-13 MED ORDER — MIDAZOLAM HCL 2 MG/2ML IJ SOLN
INTRAMUSCULAR | Status: AC
  Filled 2012-03-13: qty 4

## 2012-03-13 MED ORDER — PROMETHAZINE HCL 25 MG/ML IJ SOLN
12.5000 mg | Freq: Once | INTRAMUSCULAR | Status: AC
  Administered 2012-03-13: 12.5 mg via INTRAVENOUS

## 2012-03-13 MED ORDER — KETOROLAC TROMETHAMINE 30 MG/ML IJ SOLN
30.0000 mg | Freq: Once | INTRAMUSCULAR | Status: AC
  Administered 2012-03-13: 30 mg via INTRAVENOUS
  Filled 2012-03-13: qty 1

## 2012-03-13 MED ORDER — ONDANSETRON HCL 4 MG/2ML IJ SOLN
4.0000 mg | Freq: Once | INTRAMUSCULAR | Status: AC
  Administered 2012-03-13: 4 mg via INTRAVENOUS
  Filled 2012-03-13: qty 2

## 2012-03-13 MED ORDER — YTTRIUM 90 INJECTION: 31.9600 | INJECTION | Freq: Once | INTRAVENOUS | Status: DC

## 2012-03-13 MED ORDER — FENTANYL CITRATE 0.05 MG/ML IJ SOLN
INTRAMUSCULAR | Status: AC | PRN
  Administered 2012-03-13: 25 ug via INTRAVENOUS

## 2012-03-13 MED ORDER — PIPERACILLIN-TAZOBACTAM 3.375 G IVPB
3.3750 g | Freq: Once | INTRAVENOUS | Status: AC
  Administered 2012-03-13: 3.375 g via INTRAVENOUS
  Filled 2012-03-13: qty 50

## 2012-03-13 MED ORDER — PROMETHAZINE HCL 25 MG/ML IJ SOLN
INTRAMUSCULAR | Status: AC
  Filled 2012-03-13: qty 1

## 2012-03-13 MED ORDER — MIDAZOLAM HCL 2 MG/2ML IJ SOLN
INTRAMUSCULAR | Status: AC | PRN
  Administered 2012-03-13 (×3): 0.5 mg via INTRAVENOUS

## 2012-03-13 MED ORDER — PANTOPRAZOLE SODIUM 40 MG IV SOLR
40.0000 mg | Freq: Once | INTRAVENOUS | Status: AC
  Administered 2012-03-13: 40 mg via INTRAVENOUS
  Filled 2012-03-13: qty 40

## 2012-03-13 MED ORDER — LIDOCAINE HCL 1 % IJ SOLN
INTRAMUSCULAR | Status: AC
  Filled 2012-03-13: qty 20

## 2012-03-13 MED ORDER — SODIUM CHLORIDE 0.9 % IV SOLN
INTRAVENOUS | Status: DC
  Administered 2012-03-13: 07:00:00 via INTRAVENOUS

## 2012-03-13 MED ORDER — FENTANYL CITRATE 0.05 MG/ML IJ SOLN
INTRAMUSCULAR | Status: AC
  Filled 2012-03-13: qty 4

## 2012-03-13 MED ORDER — DEXAMETHASONE SODIUM PHOSPHATE 10 MG/ML IJ SOLN
20.0000 mg | Freq: Once | INTRAMUSCULAR | Status: AC
  Administered 2012-03-13: 20 mg via INTRAVENOUS
  Filled 2012-03-13 (×2): qty 1

## 2012-03-13 MED ORDER — IOHEXOL 300 MG/ML  SOLN
100.0000 mL | Freq: Once | INTRAMUSCULAR | Status: AC | PRN
  Administered 2012-03-13: 92 mL via INTRA_ARTERIAL

## 2012-03-13 NOTE — Procedures (Signed)
Post Y-90 radioembolization of the right lobe of the liver.  No immediate complications.  Keep right leg straight for 4 hrs.   

## 2012-03-13 NOTE — Progress Notes (Signed)
Patient complained of itching and a "warm" feeling in the groin after Decadron injection. Symptoms subsided after one minute. Denies shortness of breath, patient not in distress and no rash or swelling noted. Notified Beckey Downing, PA.

## 2012-03-13 NOTE — ED Notes (Signed)
5Fr sheath removed from R Fem artery by Dr. Grace Isaac.  Manual pressure applied X 15 mins.  R groin level 0.  3+RDP. Gauze tegaderm bandage applied.

## 2012-03-13 NOTE — H&P (Signed)
Kevin Hanson is an 67 y.o. male.   Chief Complaint: Pt with hx Colorectal Ca Liver mets Pre Y 90 embolization 03/06/12: successful Now scheduled for Hepatic arteriogram with Yttrium 90 injection In IR with Dr Grace Isaac HPI: HTN; Colon Ca; Pacemaker; Liver mets; CVA  Past Medical History  Diagnosis Date  . Hypertension   . Colon cancer   . Pacemaker   . Liver metastases   . Pneumonia   . Stroke     Past Surgical History  Procedure Laterality Date  . Colon surgery    . Insert / replace / remove pacemaker      History reviewed. No pertinent family history. Social History:  reports that he quit smoking about 7 years ago. His smoking use included Pipe. He started smoking about 44 years ago. He has never used smokeless tobacco. He reports that he does not drink alcohol. His drug history is not on file.  Allergies: No Known Allergies   (Not in a hospital admission)  Results for orders placed during the hospital encounter of 03/13/12 (from the past 48 hour(s))  CBC     Status: Abnormal   Collection Time    03/13/12  7:00 AM      Result Value Range   WBC 7.9  4.0 - 10.5 K/uL   RBC 4.08 (*) 4.22 - 5.81 MIL/uL   Hemoglobin 11.9 (*) 13.0 - 17.0 g/dL   HCT 84.1 (*) 32.4 - 40.1 %   MCV 83.8  78.0 - 100.0 fL   MCH 29.2  26.0 - 34.0 pg   MCHC 34.8  30.0 - 36.0 g/dL   RDW 02.7  25.3 - 66.4 %   Platelets 274  150 - 400 K/uL  COMPREHENSIVE METABOLIC PANEL     Status: Abnormal   Collection Time    03/13/12  7:00 AM      Result Value Range   Sodium 135  135 - 145 mEq/L   Potassium 4.5  3.5 - 5.1 mEq/L   Chloride 102  96 - 112 mEq/L   CO2 21  19 - 32 mEq/L   Glucose, Bld 106 (*) 70 - 99 mg/dL   BUN 39 (*) 6 - 23 mg/dL   Creatinine, Ser 4.03 (*) 0.50 - 1.35 mg/dL   Calcium 8.8  8.4 - 47.4 mg/dL   Total Protein 7.1  6.0 - 8.3 g/dL   Albumin 3.0 (*) 3.5 - 5.2 g/dL   AST 29  0 - 37 U/L   ALT 52  0 - 53 U/L   Alkaline Phosphatase 79  39 - 117 U/L   Total Bilirubin 0.3  0.3 - 1.2 mg/dL    GFR calc non Af Amer 46 (*) >90 mL/min   GFR calc Af Amer 53 (*) >90 mL/min   Comment:            The eGFR has been calculated     using the CKD EPI equation.     This calculation has not been     validated in all clinical     situations.     eGFR's persistently     <90 mL/min signify     possible Chronic Kidney Disease.   No results found.  Review of Systems  Constitutional: Negative for fever and chills.  Respiratory: Negative for cough.   Cardiovascular: Negative for chest pain.  Gastrointestinal: Negative for nausea and vomiting.  Neurological: Negative for headaches.    Blood pressure 106/67, pulse 92, temperature 97.4 F (36.3  C), temperature source Oral, resp. rate 18, SpO2 100.00%. Physical Exam  Constitutional: He is oriented to person, place, and time. He appears well-developed and well-nourished.  Cardiovascular: Normal rate, regular rhythm and normal heart sounds.   No murmur heard. Respiratory: Effort normal and breath sounds normal. He has no wheezes.  GI: Soft. Bowel sounds are normal. There is no tenderness.  Musculoskeletal: Normal range of motion.  Neurological: He is alert and oriented to person, place, and time.  Skin: Skin is warm and dry.  Psychiatric: He has a normal mood and affect. His behavior is normal. Judgment and thought content normal.     Assessment/Plan Colon Ca; Liver mets Pre Y90 embo 03/06/12 Now scheduled for Y90 treatmemt: Hepatic arteriogram with Yttrium 90 injection Pt and family aware of procedure benefits and risks and agreeable to proceed Consent signed and in chart    Kevin Hanson A 03/13/2012, 8:17 AM

## 2012-03-13 NOTE — ED Notes (Signed)
Pt transported to Nuc Med for post Y-90 imaging on bed with RN/monitor.

## 2012-03-26 ENCOUNTER — Other Ambulatory Visit (HOSPITAL_COMMUNITY): Payer: Self-pay | Admitting: Interventional Radiology

## 2012-03-26 DIAGNOSIS — C19 Malignant neoplasm of rectosigmoid junction: Secondary | ICD-10-CM

## 2012-03-26 DIAGNOSIS — C787 Secondary malignant neoplasm of liver and intrahepatic bile duct: Secondary | ICD-10-CM

## 2012-04-25 ENCOUNTER — Ambulatory Visit
Admission: RE | Admit: 2012-04-25 | Discharge: 2012-04-25 | Disposition: A | Discharge status: Home / Self Care | Payer: BC Managed Care – PPO | Source: Ambulatory Visit | Attending: Interventional Radiology | Admitting: Interventional Radiology

## 2012-04-25 DIAGNOSIS — C19 Malignant neoplasm of rectosigmoid junction: Secondary | ICD-10-CM

## 2012-04-25 DIAGNOSIS — C787 Secondary malignant neoplasm of liver and intrahepatic bile duct: Secondary | ICD-10-CM

## 2012-04-25 NOTE — Progress Notes (Signed)
Denies discomfort, bloating, nausea, vomiting or diarrhea.  Appetite: improving.  Sleeping:  Good.    Endurance improving.    Kevin Hanson, Charity fundraiser

## 2012-04-26 ENCOUNTER — Telehealth: Payer: Self-pay | Admitting: Emergency Medicine

## 2012-04-26 NOTE — Telephone Encounter (Signed)
LMOVM FOR RN LINE TO MAKE THEM AWARE THAT THE PT IS SCHEDULED FOR HIS 2ND Y-90 TX PRE MAPPING- 05-09-12 AND THE TX IS ON 05-23-12.  WILL THEY BE RESPONSIBLE FOR BRIDGING HIM OVER TO LOVENOX INJ. FROM HIS COUMADIN.?   SEE OTHER PHONE NOTE

## 2012-04-29 ENCOUNTER — Other Ambulatory Visit (HOSPITAL_COMMUNITY): Payer: Self-pay | Admitting: Interventional Radiology

## 2012-04-29 ENCOUNTER — Other Ambulatory Visit: Payer: Self-pay | Admitting: Oncology

## 2012-04-29 DIAGNOSIS — C787 Secondary malignant neoplasm of liver and intrahepatic bile duct: Secondary | ICD-10-CM

## 2012-04-29 DIAGNOSIS — C19 Malignant neoplasm of rectosigmoid junction: Secondary | ICD-10-CM

## 2012-04-30 ENCOUNTER — Telehealth: Payer: Self-pay | Admitting: Emergency Medicine

## 2012-04-30 NOTE — Telephone Encounter (Signed)
CALLED KELLI, PA BACK FROM HER MESSAGE LEFT HER ON Friday.  PER DR MCCARTY- SHE HAS SPOKEN WITH  DR MUNLEY, PT DOES NOT NEED TO BE BRIDGED W/ LOVENOX INJ. FOR TREATMENT THIS TIME.   DR MUNLEY HAS REPEATED AN ECHO AND WAS IT WAS FINE.  WILL MAKE DR Marco Collie.  Jari Sportsman, EMT 04/30/2012 11:34 AM

## 2012-05-03 ENCOUNTER — Other Ambulatory Visit: Payer: Self-pay | Admitting: Radiology

## 2012-05-06 ENCOUNTER — Other Ambulatory Visit: Payer: Self-pay | Admitting: Radiology

## 2012-05-06 ENCOUNTER — Encounter (HOSPITAL_COMMUNITY): Payer: Self-pay | Admitting: Pharmacy Technician

## 2012-05-09 ENCOUNTER — Encounter (HOSPITAL_COMMUNITY)
Admission: RE | Admit: 2012-05-09 | Discharge: 2012-05-09 | Disposition: A | Discharge status: Home / Self Care | Payer: BC Managed Care – PPO | Source: Ambulatory Visit | Attending: Interventional Radiology | Admitting: Interventional Radiology

## 2012-05-09 ENCOUNTER — Encounter (HOSPITAL_COMMUNITY): Payer: Self-pay

## 2012-05-09 ENCOUNTER — Ambulatory Visit (HOSPITAL_COMMUNITY)
Admission: RE | Admit: 2012-05-09 | Discharge: 2012-05-09 | Disposition: A | Discharge status: Home / Self Care | Payer: BC Managed Care – PPO | Source: Ambulatory Visit | Attending: Interventional Radiology | Admitting: Interventional Radiology

## 2012-05-09 ENCOUNTER — Other Ambulatory Visit (HOSPITAL_COMMUNITY): Payer: Self-pay | Admitting: Interventional Radiology

## 2012-05-09 DIAGNOSIS — C787 Secondary malignant neoplasm of liver and intrahepatic bile duct: Secondary | ICD-10-CM

## 2012-05-09 DIAGNOSIS — Z7901 Long term (current) use of anticoagulants: Secondary | ICD-10-CM | POA: Insufficient documentation

## 2012-05-09 DIAGNOSIS — C19 Malignant neoplasm of rectosigmoid junction: Secondary | ICD-10-CM

## 2012-05-09 DIAGNOSIS — Z8673 Personal history of transient ischemic attack (TIA), and cerebral infarction without residual deficits: Secondary | ICD-10-CM | POA: Insufficient documentation

## 2012-05-09 DIAGNOSIS — C189 Malignant neoplasm of colon, unspecified: Secondary | ICD-10-CM | POA: Insufficient documentation

## 2012-05-09 DIAGNOSIS — Z950 Presence of cardiac pacemaker: Secondary | ICD-10-CM | POA: Insufficient documentation

## 2012-05-09 DIAGNOSIS — Z79899 Other long term (current) drug therapy: Secondary | ICD-10-CM | POA: Insufficient documentation

## 2012-05-09 DIAGNOSIS — I10 Essential (primary) hypertension: Secondary | ICD-10-CM | POA: Insufficient documentation

## 2012-05-09 LAB — PROTIME-INR: INR: 0.97 (ref 0.00–1.49)

## 2012-05-09 LAB — COMPREHENSIVE METABOLIC PANEL
AST: 44 U/L — ABNORMAL HIGH (ref 0–37)
Albumin: 2.9 g/dL — ABNORMAL LOW (ref 3.5–5.2)
Calcium: 8.8 mg/dL (ref 8.4–10.5)
Chloride: 105 mEq/L (ref 96–112)
Creatinine, Ser: 1.36 mg/dL — ABNORMAL HIGH (ref 0.50–1.35)

## 2012-05-09 LAB — CBC
HCT: 34.5 % — ABNORMAL LOW (ref 39.0–52.0)
MCV: 77.9 fL — ABNORMAL LOW (ref 78.0–100.0)
RBC: 4.43 MIL/uL (ref 4.22–5.81)
WBC: 6.2 10*3/uL (ref 4.0–10.5)

## 2012-05-09 MED ORDER — MIDAZOLAM HCL 2 MG/2ML IJ SOLN
INTRAMUSCULAR | Status: AC
  Filled 2012-05-09: qty 6

## 2012-05-09 MED ORDER — IOHEXOL 300 MG/ML  SOLN
80.0000 mL | Freq: Once | INTRAMUSCULAR | Status: AC | PRN
  Administered 2012-05-09: 80 mL via INTRA_ARTERIAL

## 2012-05-09 MED ORDER — NITROGLYCERIN 1 MG/10 ML FOR IR/CATH LAB
100.0000 ug | INTRA_ARTERIAL | Status: AC
  Administered 2012-05-09: 100 ug via INTRA_ARTERIAL

## 2012-05-09 MED ORDER — TECHNETIUM TO 99M ALBUMIN AGGREGATED
2.4000 | Freq: Once | INTRAVENOUS | Status: AC | PRN
  Administered 2012-05-09: 2 via INTRAVENOUS

## 2012-05-09 MED ORDER — FENTANYL CITRATE 0.05 MG/ML IJ SOLN
INTRAMUSCULAR | Status: AC | PRN
  Administered 2012-05-09 (×2): 50 ug via INTRAVENOUS

## 2012-05-09 MED ORDER — FENTANYL CITRATE 0.05 MG/ML IJ SOLN
INTRAMUSCULAR | Status: AC
  Filled 2012-05-09: qty 6

## 2012-05-09 MED ORDER — MIDAZOLAM HCL 2 MG/2ML IJ SOLN
INTRAMUSCULAR | Status: AC | PRN
  Administered 2012-05-09 (×2): 1 mg via INTRAVENOUS

## 2012-05-09 MED ORDER — LIDOCAINE HCL 1 % IJ SOLN
INTRAMUSCULAR | Status: AC
  Filled 2012-05-09: qty 20

## 2012-05-09 MED ORDER — HEPARIN SOD (PORK) LOCK FLUSH 100 UNIT/ML IV SOLN
INTRAVENOUS | Status: AC
  Administered 2012-05-09: 500 [IU]
  Filled 2012-05-09: qty 5

## 2012-05-09 MED ORDER — SODIUM CHLORIDE 0.9 % IV SOLN
INTRAVENOUS | Status: DC
  Administered 2012-05-09: 07:00:00 via INTRAVENOUS

## 2012-05-09 MED ORDER — HEPARIN SOD (PORK) LOCK FLUSH 100 UNIT/ML IV SOLN: 500.0000 [IU] | INTRAVENOUS | Status: DC | PRN

## 2012-05-09 NOTE — ED Notes (Signed)
Pt transported to Nuc Med via bed with RN and monitor for post Y-90 imaging.

## 2012-05-09 NOTE — H&P (Signed)
Kevin Hanson is an 67 y.o. male.   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   Past Medical History  Diagnosis Date  . Hypertension   . Colon cancer   . Pacemaker   . Liver metastases   . Pneumonia   . Stroke     Past Surgical History  Procedure Laterality Date  . Colon surgery    . Insert / replace / remove pacemaker      No family history on file. Social History:  reports that he quit smoking about 7 years ago. His smoking use included Pipe. He started smoking about 44 years ago. He has never used smokeless tobacco. He reports that he does not drink alcohol. His drug history is not on file.  Allergies: No Known Allergies  Current outpatient prescriptions:carvedilol (COREG) 3.125 MG tablet, Take 3.125 mg by mouth 2 (two) times daily with a meal., Disp: , Rfl: ;  furosemide (LASIX) 20 MG tablet, Take 20 mg by mouth daily before breakfast., Disp: , Rfl: ;  lisinopril (PRINIVIL,ZESTRIL) 5 MG tablet, Take 5 mg by mouth daily before breakfast., Disp: , Rfl: ;  magnesium oxide (MAG-OX) 400 MG tablet, Take 400 mg by mouth daily., Disp: , Rfl:  Multiple Vitamin (MULTIVITAMIN WITH MINERALS) TABS, Take 1 tablet by mouth daily., Disp: , Rfl: ;  potassium chloride (K-DUR,KLOR-CON) 10 MEQ tablet, Take 10 mEq by mouth daily., Disp: , Rfl: ;  enoxaparin (LOVENOX) 120 MG/0.8ML injection, Inject 120 mg into the skin daily. , Disp: , Rfl: ;  warfarin (COUMADIN) 5 MG tablet, Take 5 mg by mouth daily before breakfast., Disp: , Rfl:  Current facility-administered medications:0.9 %  sodium chloride infusion, , Intravenous, Continuous, D Kevin Tatiyanna Lashley, PA-C Facility-Administered Medications Ordered in Other Encounters: fentaNYL (SUBLIMAZE) 0.05 MG/ML injection, , , , ;   midazolam (VERSED) 2 MG/2ML injection, , , ,    Results for orders placed during the hospital encounter of 05/09/12 (from the past 48 hour(s))  APTT     Status: None   Collection Time    05/09/12  7:40 AM      Result Value Range   aPTT 26  24 - 37 seconds  CBC     Status: Abnormal   Collection Time    05/09/12  7:40 AM      Result Value Range   WBC 6.2  4.0 - 10.5 K/uL   RBC 4.43  4.22 - 5.81 MIL/uL   Hemoglobin 11.4 (*) 13.0 - 17.0 g/dL   HCT 16.1 (*) 09.6 - 04.5 %   MCV 77.9 (*) 78.0 - 100.0 fL   MCH 25.7 (*) 26.0 - 34.0 pg   MCHC 33.0  30.0 - 36.0 g/dL   RDW 40.9  81.1 - 91.4 %   Platelets 238  150 - 400 K/uL  COMPREHENSIVE METABOLIC PANEL     Status: Abnormal   Collection Time    05/09/12  7:40 AM      Result Value Range   Sodium 139  135 - 145 mEq/L   Potassium 4.4  3.5 - 5.1 mEq/L   Chloride 105  96 - 112 mEq/L   CO2 25  19 - 32 mEq/L   Glucose, Bld 103 (*) 70 - 99 mg/dL   BUN 22  6 -  23 mg/dL   Creatinine, Ser 4.09 (*) 0.50 - 1.35 mg/dL   Calcium 8.8  8.4 - 81.1 mg/dL   Total Protein 6.5  6.0 - 8.3 g/dL   Albumin 2.9 (*) 3.5 - 5.2 g/dL   AST 44 (*) 0 - 37 U/L   ALT 63 (*) 0 - 53 U/L   Alkaline Phosphatase 123 (*) 39 - 117 U/L   Total Bilirubin 0.2 (*) 0.3 - 1.2 mg/dL   GFR calc non Af Amer 53 (*) >90 mL/min   GFR calc Af Amer 62 (*) >90 mL/min   Comment:            The eGFR has been calculated     using the CKD EPI equation.     This calculation has not been     validated in all clinical     situations.     eGFR's persistently     <90 mL/min signify     possible Chronic Kidney Disease.  PROTIME-INR     Status: None   Collection Time    05/09/12  7:40 AM      Result Value Range   Prothrombin Time 12.8  11.6 - 15.2 seconds   INR 0.97  0.00 - 1.49   No results found.  Review of Systems  Constitutional: Negative for fever and chills.  Respiratory: Negative for shortness of breath.        Occ cough  Cardiovascular: Negative for chest pain.   Gastrointestinal: Negative for nausea, vomiting and abdominal pain.  Musculoskeletal: Negative for back pain.  Neurological: Negative for headaches.  Endo/Heme/Allergies: Does not bruise/bleed easily.    Blood pressure 136/77, pulse 76, temperature 98.1 F (36.7 C), temperature source Oral, resp. rate 16, height 5\' 5"  (1.651 m), weight 176 lb (79.833 kg), SpO2 96.00%. Physical Exam  Constitutional: He is oriented to person, place, and time. He appears well-developed and well-nourished.  Cardiovascular: Normal rate and regular rhythm.   Pacer present  Respiratory: Effort normal and breath sounds normal.  Clean, intact rt chest PAC  GI: Soft. Bowel sounds are normal. There is no tenderness.  LLQ colostomy intact  Musculoskeletal: Normal range of motion. He exhibits no edema.  Neurological: He is alert and oriented to person, place, and time.     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 Plan is for left hepatic arteriogram today with MMA test radiotracer embolization prior to planned Y-90 radioembolization. Details/risks of procedure d/w pt/wife with their understanding and consent.  Kevin Hanson,D KEVIN 05/09/2012, 9:12 AM

## 2012-05-09 NOTE — ED Notes (Signed)
5Fr R Fem Artery sheath removed by Dr. Grace Isaac using manual pressure.  Groin level 0, 3+RDP.

## 2012-05-09 NOTE — ED Notes (Signed)
R Groin level 0, 3+RDP.  Gauze tegaderm drsg applied.

## 2012-05-09 NOTE — ED Notes (Signed)
Pt transported to Short Stay for recovery via bed with RN. 

## 2012-05-09 NOTE — Procedures (Signed)
Post mesenteric arteriogram with pre Y-90 MAA embolization.  No immediate complications.  Keep right leg straight for 4 hrs.

## 2012-05-20 ENCOUNTER — Encounter (HOSPITAL_COMMUNITY): Payer: Self-pay | Admitting: Pharmacy Technician

## 2012-05-20 ENCOUNTER — Other Ambulatory Visit: Payer: Self-pay | Admitting: Radiology

## 2012-05-23 ENCOUNTER — Ambulatory Visit (HOSPITAL_COMMUNITY)
Admission: RE | Admit: 2012-05-23 | Discharge: 2012-05-23 | Disposition: A | Discharge status: Home / Self Care | Payer: BC Managed Care – PPO | Source: Ambulatory Visit | Attending: Interventional Radiology | Admitting: Interventional Radiology

## 2012-05-23 ENCOUNTER — Other Ambulatory Visit (HOSPITAL_COMMUNITY): Payer: Self-pay | Admitting: Interventional Radiology

## 2012-05-23 ENCOUNTER — Encounter (HOSPITAL_COMMUNITY): Payer: Self-pay

## 2012-05-23 ENCOUNTER — Encounter (HOSPITAL_COMMUNITY)
Admission: RE | Admit: 2012-05-23 | Discharge: 2012-05-23 | Disposition: A | Discharge status: Home / Self Care | Payer: BC Managed Care – PPO | Source: Ambulatory Visit | Attending: Interventional Radiology | Admitting: Interventional Radiology

## 2012-05-23 DIAGNOSIS — C19 Malignant neoplasm of rectosigmoid junction: Secondary | ICD-10-CM

## 2012-05-23 DIAGNOSIS — C787 Secondary malignant neoplasm of liver and intrahepatic bile duct: Secondary | ICD-10-CM | POA: Insufficient documentation

## 2012-05-23 DIAGNOSIS — Z79899 Other long term (current) drug therapy: Secondary | ICD-10-CM | POA: Insufficient documentation

## 2012-05-23 DIAGNOSIS — I10 Essential (primary) hypertension: Secondary | ICD-10-CM | POA: Insufficient documentation

## 2012-05-23 DIAGNOSIS — Z8673 Personal history of transient ischemic attack (TIA), and cerebral infarction without residual deficits: Secondary | ICD-10-CM | POA: Insufficient documentation

## 2012-05-23 DIAGNOSIS — C189 Malignant neoplasm of colon, unspecified: Secondary | ICD-10-CM | POA: Insufficient documentation

## 2012-05-23 LAB — COMPREHENSIVE METABOLIC PANEL
ALT: 55 U/L — ABNORMAL HIGH (ref 0–53)
Alkaline Phosphatase: 137 U/L — ABNORMAL HIGH (ref 39–117)
CO2: 24 mEq/L (ref 19–32)
Calcium: 9.5 mg/dL (ref 8.4–10.5)
Chloride: 106 mEq/L (ref 96–112)
GFR calc Af Amer: 52 mL/min — ABNORMAL LOW (ref >90)
GFR calc non Af Amer: 45 mL/min — ABNORMAL LOW (ref >90)
Glucose, Bld: 108 mg/dL — ABNORMAL HIGH (ref 70–99)
Potassium: 4.5 mEq/L (ref 3.5–5.1)
Sodium: 140 mEq/L (ref 135–145)
Total Bilirubin: 0.3 mg/dL (ref 0.3–1.2)

## 2012-05-23 LAB — CBC
Hemoglobin: 11.5 g/dL — ABNORMAL LOW (ref 13.0–17.0)
MCH: 25.2 pg — ABNORMAL LOW (ref 26.0–34.0)
RBC: 4.57 MIL/uL (ref 4.22–5.81)

## 2012-05-23 MED ORDER — MIDAZOLAM HCL 2 MG/2ML IJ SOLN
INTRAMUSCULAR | Status: AC | PRN
  Administered 2012-05-23: 1 mg via INTRAVENOUS

## 2012-05-23 MED ORDER — KETOROLAC TROMETHAMINE 30 MG/ML IJ SOLN
30.0000 mg | Freq: Once | INTRAMUSCULAR | Status: AC
  Administered 2012-05-23: 30 mg via INTRAVENOUS
  Filled 2012-05-23: qty 1

## 2012-05-23 MED ORDER — SODIUM CHLORIDE 0.9 % IV SOLN
INTRAVENOUS | Status: DC
  Administered 2012-05-23: 07:00:00 via INTRAVENOUS

## 2012-05-23 MED ORDER — DEXAMETHASONE SODIUM PHOSPHATE 10 MG/ML IJ SOLN
20.0000 mg | Freq: Once | INTRAMUSCULAR | Status: AC
  Administered 2012-05-23: 20 mg via INTRAVENOUS
  Filled 2012-05-23: qty 2

## 2012-05-23 MED ORDER — PANTOPRAZOLE SODIUM 40 MG IV SOLR
40.0000 mg | Freq: Once | INTRAVENOUS | Status: AC
  Administered 2012-05-23: 40 mg via INTRAVENOUS
  Filled 2012-05-23: qty 40

## 2012-05-23 MED ORDER — FENTANYL CITRATE 0.05 MG/ML IJ SOLN
INTRAMUSCULAR | Status: AC | PRN
  Administered 2012-05-23: 50 ug via INTRAVENOUS

## 2012-05-23 MED ORDER — YTTRIUM 90 INJECTION: 19.6000 | INJECTION | Freq: Once | INTRAVENOUS | Status: DC

## 2012-05-23 MED ORDER — ONDANSETRON HCL 4 MG/2ML IJ SOLN
4.0000 mg | Freq: Once | INTRAMUSCULAR | Status: AC
  Administered 2012-05-23: 4 mg via INTRAVENOUS
  Filled 2012-05-23: qty 2

## 2012-05-23 MED ORDER — IOHEXOL 300 MG/ML  SOLN
80.0000 mL | Freq: Once | INTRAMUSCULAR | Status: AC | PRN
  Administered 2012-05-23: 80 mL via INTRA_ARTERIAL

## 2012-05-23 MED ORDER — FENTANYL CITRATE 0.05 MG/ML IJ SOLN
INTRAMUSCULAR | Status: AC
  Filled 2012-05-23: qty 6

## 2012-05-23 MED ORDER — HEPARIN SOD (PORK) LOCK FLUSH 100 UNIT/ML IV SOLN
500.0000 [IU] | INTRAVENOUS | Status: AC | PRN
  Administered 2012-05-23: 500 [IU]

## 2012-05-23 MED ORDER — MIDAZOLAM HCL 2 MG/2ML IJ SOLN
INTRAMUSCULAR | Status: AC
  Filled 2012-05-23: qty 6

## 2012-05-23 MED ORDER — PIPERACILLIN-TAZOBACTAM 3.375 G IVPB
3.3750 g | Freq: Once | INTRAVENOUS | Status: AC
  Administered 2012-05-23: 3.375 g via INTRAVENOUS
  Filled 2012-05-23: qty 50

## 2012-05-23 MED ORDER — HEPARIN SOD (PORK) LOCK FLUSH 100 UNIT/ML IV SOLN
INTRAVENOUS | Status: AC
  Filled 2012-05-23: qty 5

## 2012-05-23 NOTE — Procedures (Signed)
Post Y-90 radioembolization of the left lobe of the liver.  No immediate complications.  Keep right leg straight for 3 hrs.

## 2012-05-23 NOTE — ED Notes (Signed)
Nuc Med not available for post procedure imaging at this time.  Pt will be monitored in Room 7 until they are ready.

## 2012-05-23 NOTE — ED Notes (Signed)
Pt transported to Nuc Med for post procedure imaging.  Transported on bed with RN and monitor.

## 2012-05-23 NOTE — ED Notes (Signed)
5Fr sheath removed from Fem Artery by Dr. Grace Isaac. Hemostasis achieved using the Mynx device.  R groin level 0, 2+RPT.

## 2012-05-23 NOTE — H&P (Signed)
Kevin Hanson is an 67 y.o. male.   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  Past Medical History  Diagnosis Date  . Hypertension   . Colon cancer   . Pacemaker   . Liver metastases   . Pneumonia   . Stroke   renal insufficiency  Past Surgical History  Procedure Laterality Date  . Colon surgery    . Insert / replace / remove pacemaker      History reviewed. No pertinent family history. Social History:  reports that he quit smoking about 7 years ago. His smoking use included Pipe. He started smoking about 44 years ago. He has never used smokeless tobacco. He reports that he does not drink alcohol. His drug history is not on file.  Allergies: No Known Allergies  Current outpatient prescriptions:carvedilol (COREG) 6.25 MG tablet, Take 6.25 mg by mouth 2 (two) times daily with a meal., Disp: , Rfl: ;  furosemide (LASIX) 20 MG tablet, Take 20 mg by mouth daily before breakfast., Disp: , Rfl: ;  lisinopril (PRINIVIL,ZESTRIL) 5 MG tablet, Take 5 mg by mouth daily before breakfast., Disp: , Rfl: ;  Multiple Vitamin (MULTIVITAMIN WITH MINERALS) TABS, Take 1 tablet by mouth daily., Disp: , Rfl:  potassium chloride (K-DUR,KLOR-CON) 10 MEQ tablet, Take 10 mEq by mouth daily., Disp: , Rfl: ;  enoxaparin (LOVENOX) 120 MG/0.8ML injection, Inject 120 mg into the skin daily. , Disp: , Rfl: ;  warfarin (COUMADIN) 5 MG tablet, Take 5 mg by mouth daily before breakfast., Disp: , Rfl:  Current facility-administered medications:0.9 %  sodium chloride infusion, , Intravenous, Continuous, D Jeananne Rama, PA-C, Last Rate: 75 mL/hr at 05/23/12 0713;  piperacillin-tazobactam (ZOSYN) IVPB 3.375 g, 3.375 g, Intravenous, Once, D Jeananne Rama, PA-C   Results for orders placed during the hospital encounter of 05/23/12  (from the past 48 hour(s))  APTT     Status: None   Collection Time    05/23/12  7:05 AM      Result Value Range   aPTT 29  24 - 37 seconds  CBC     Status: Abnormal   Collection Time    05/23/12  7:05 AM      Result Value Range   WBC 6.1  4.0 - 10.5 K/uL   RBC 4.57  4.22 - 5.81 MIL/uL   Hemoglobin 11.5 (*) 13.0 - 17.0 g/dL   HCT 63.8 (*) 75.6 - 43.3 %   MCV 77.5 (*) 78.0 - 100.0 fL   MCH 25.2 (*) 26.0 - 34.0 pg   MCHC 32.5  30.0 - 36.0 g/dL   RDW 29.5 (*) 18.8 - 41.6 %   Platelets 274  150 - 400 K/uL  COMPREHENSIVE METABOLIC PANEL     Status: Abnormal   Collection Time    05/23/12  7:05 AM      Result Value Range   Sodium 140  135 - 145 mEq/L   Potassium 4.5  3.5 - 5.1 mEq/L   Chloride 106  96 - 112 mEq/L   CO2 24  19 - 32 mEq/L   Glucose, Bld 108 (*) 70 - 99 mg/dL   BUN 29 (*) 6 - 23 mg/dL   Creatinine, Ser 6.06 (*) 0.50 - 1.35 mg/dL   Calcium 9.5  8.4 - 30.1 mg/dL   Total Protein  7.1  6.0 - 8.3 g/dL   Albumin 3.2 (*) 3.5 - 5.2 g/dL   AST 40 (*) 0 - 37 U/L   ALT 55 (*) 0 - 53 U/L   Alkaline Phosphatase 137 (*) 39 - 117 U/L   Total Bilirubin 0.3  0.3 - 1.2 mg/dL   GFR calc non Af Amer 45 (*) >90 mL/min   GFR calc Af Amer 52 (*) >90 mL/min   Comment:            The eGFR has been calculated     using the CKD EPI equation.     This calculation has not been     validated in all clinical     situations.     eGFR's persistently     <90 mL/min signify     possible Chronic Kidney Disease.  PROTIME-INR     Status: None   Collection Time    05/23/12  7:05 AM      Result Value Range   Prothrombin Time 12.7  11.6 - 15.2 seconds   INR 0.96  0.00 - 1.49   No results found.  Review of Systems  Constitutional: Negative for fever and chills.  Respiratory: Negative for cough and shortness of breath.   Cardiovascular: Negative for chest pain.  Gastrointestinal: Negative for nausea, vomiting and abdominal pain.  Musculoskeletal: Negative for back pain.  Neurological:  Negative for headaches.  Endo/Heme/Allergies: Does not bruise/bleed easily.    Blood pressure 116/78, pulse 88, temperature 96.8 F (36 C), temperature source Oral, resp. rate 16, SpO2 99.00%. Physical Exam  Constitutional: He is oriented to person, place, and time. He appears well-developed and well-nourished.  Cardiovascular: Normal rate and regular rhythm.   Pacer present  Respiratory: Effort normal and breath sounds normal.  GI: Soft. Bowel sounds are normal. There is no tenderness.  Intact LLQ colostomy   Musculoskeletal: Normal range of motion. He exhibits no edema.  Neurological: He is alert and oriented to person, place, and time.     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 Plan is for radioembolization to left lobe liver today. Details/risks of above d/w pt/wife with their understanding and consent.  Yuvraj Pfeifer,D KEVIN 05/23/2012, 8:12 AM

## 2012-05-23 NOTE — ED Notes (Signed)
R groin level 0, 2+RPT. Gauze tegaderm bandage applied.

## 2012-05-23 NOTE — Progress Notes (Addendum)
Patient was given option to receive foley at pre-procedure or once he receives sedation. He stated he has never had a foley in the previous times he has had this procedure and would like to speak with Dr. Grace Isaac prior to foley being placed. Spoke with Dr. Grace Isaac and patient does not need foley.

## 2012-06-20 ENCOUNTER — Ambulatory Visit
Admission: RE | Admit: 2012-06-20 | Discharge: 2012-06-20 | Disposition: A | Discharge status: Home / Self Care | Payer: Medicare Other | Source: Ambulatory Visit | Attending: Interventional Radiology | Admitting: Interventional Radiology

## 2012-06-20 DIAGNOSIS — C787 Secondary malignant neoplasm of liver and intrahepatic bile duct: Secondary | ICD-10-CM

## 2012-06-20 DIAGNOSIS — C19 Malignant neoplasm of rectosigmoid junction: Secondary | ICD-10-CM

## 2012-06-20 NOTE — Progress Notes (Signed)
Appetite:  Good.  Denies nausea, vomiting or diarrhea.  Denies pain associated w/ procedure.    Fatigues easily.  Takes rest intervals as needed.    Sleeping: well.  Next f/u w/ Dr Gilman Buttner on 06/26/2012.    Brinna Divelbiss Carmell [place], Charity fundraiser

## 2012-08-07 ENCOUNTER — Other Ambulatory Visit: Payer: Self-pay | Admitting: Oncology

## 2012-08-07 DIAGNOSIS — C19 Malignant neoplasm of rectosigmoid junction: Secondary | ICD-10-CM

## 2012-08-07 DIAGNOSIS — C189 Malignant neoplasm of colon, unspecified: Secondary | ICD-10-CM

## 2012-09-10 ENCOUNTER — Other Ambulatory Visit (HOSPITAL_COMMUNITY): Payer: Medicare Other

## 2012-09-11 ENCOUNTER — Encounter (HOSPITAL_COMMUNITY)
Admission: RE | Admit: 2012-09-11 | Discharge: 2012-09-11 | Disposition: A | Discharge status: Home / Self Care | Payer: Medicare Other | Source: Ambulatory Visit | Attending: Oncology | Admitting: Oncology

## 2012-09-11 ENCOUNTER — Other Ambulatory Visit: Payer: Self-pay | Admitting: Lactation Services

## 2012-09-11 DIAGNOSIS — C19 Malignant neoplasm of rectosigmoid junction: Secondary | ICD-10-CM | POA: Insufficient documentation

## 2012-09-11 DIAGNOSIS — C189 Malignant neoplasm of colon, unspecified: Secondary | ICD-10-CM | POA: Insufficient documentation

## 2012-09-11 LAB — GLUCOSE, CAPILLARY: Glucose-Capillary: 102 mg/dL — ABNORMAL HIGH (ref 70–99)

## 2012-09-11 MED ORDER — FLUDEOXYGLUCOSE F - 18 (FDG) INJECTION
17.7000 | Freq: Once | INTRAVENOUS | Status: AC | PRN
  Administered 2012-09-11: 17.7 via INTRAVENOUS

## 2012-11-25 ENCOUNTER — Other Ambulatory Visit (HOSPITAL_COMMUNITY): Payer: Self-pay | Admitting: Oncology

## 2012-11-25 DIAGNOSIS — C19 Malignant neoplasm of rectosigmoid junction: Secondary | ICD-10-CM

## 2012-12-04 ENCOUNTER — Other Ambulatory Visit (HOSPITAL_COMMUNITY): Payer: Medicare Other

## 2014-08-31 DEATH — deceased

## 2014-11-25 IMAGING — NM NM SPECIAL MED RAD PHYSICS CONS
4 series · 24 of 24 positions shown · non-contrast
Comparison: PET CT scan 01/18/2012, CT scan 01/22/2012, and MAA
05/09/2012

Radiopharmaceutical:19.6 mCi [AGE] microspheres.

CLINICAL DATA: Unresectable  tumor metastasis to the liver.
Treatment of left hepatic lobe. The patient's second radio
embolization.  First through the left lobe.  The patient has
unresectable colorectal metastasis to the liver.

Exam: NM SPECIAL TREATMENT PROCEDURE,NM SPECIAL MED RAD PHYSICS
CONS,NM RADIO PHARM THERAPY INTRAARTERIAL
TECHNIQUE: In  conjunction with the interventional radiologist a Y
- Microsphere dose was calculated utilizing body surface area
formulation.  Calculated dose equal 19.7 mCi.  Pre therapy MAA
liver SPECT scan and CTA were evaluated.  Utilizing a microcatheter
system, the left hepatic artery was selected and  Y-90 microspheres
were delivered in fractionated aliquots.  Radiopharmaceutical was
delivered by the interventional radiologist and  nuclear
radiologist.  The patient tolerated procedure well.  No adverse
effects were noted.  Bremsstrahlung scan to follow.

[Series 1: mc (age) micro · 4.7mm · 4.75mm/px · 6 of 91 frames shown (1 of 4)]
[frame 8/91  full-range]
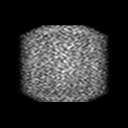
[frame 23/91  full-range]
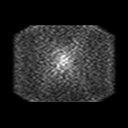
[frame 38/91  full-range]
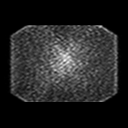
[frame 53/91  full-range]
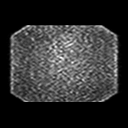
[frame 68/91  full-range]
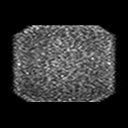
[frame 84/91  full-range]
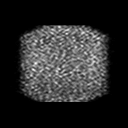

[Series 1: mc (age) micro · 4.7mm · 4.75mm/px · 6 of 91 frames shown (2 of 4)]
[frame 8/91  full-range]
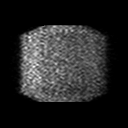
[frame 23/91  full-range]
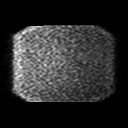
[frame 38/91  full-range]
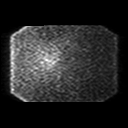
[frame 53/91  full-range]
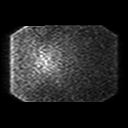
[frame 68/91  full-range]
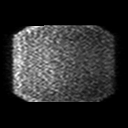
[frame 84/91  full-range]
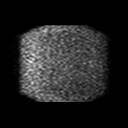

[Series 1: mc (age) micro · 4.75mm/px · 6 of 63 frames shown (3 of 4)]
[frame 6/63  full-range]
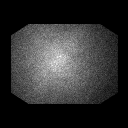
[frame 16/63  full-range]
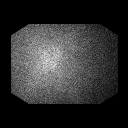
[frame 27/63  full-range]
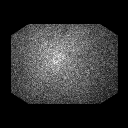
[frame 37/63  full-range]
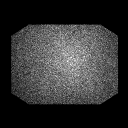
[frame 48/63  full-range]
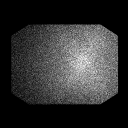
[frame 58/63  full-range]
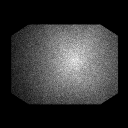

[Series 1: mc (age) micro · 4.7mm · 4.75mm/px · 6 of 91 frames shown (4 of 4)]
[frame 8/91  full-range]
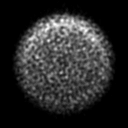
[frame 23/91  full-range]
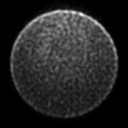
[frame 38/91  full-range]
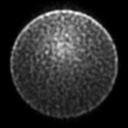
[frame 53/91  full-range]
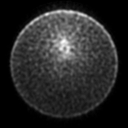
[frame 68/91  full-range]
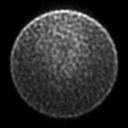
[frame 84/91  full-range]
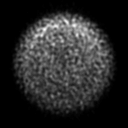

[24 of 24 positions shown; findings below may reference images not displayed]

FINDINGS: [AGE] microspheres therapy to the left hepatic lobe as
above.
IMPRESSION: Successful [AGE] microsphere delivery for treatment of
unresectable liver metastasis.

## 2014-11-25 IMAGING — XA IR EMBO TUMOR ORGAN ISCHEMIA INFARCT INC GUIDE ROADMAPPING
6 series · 13 of 23 positions shown · non-contrast
Comparison: none

INDICATION: History of metastatic colorectal cancer.
TECHNIQUE: Informed written consent was obtained from the patient after a
discussion of the risks, benefits and alternatives to treatment.
Questions regarding the procedure were encouraged and answered.  A
timeout was performed prior to the initiation of the procedure.

[Series 2: care body 4 · 2 of 33 frames shown (1 of 3)]
[frame 5/33]
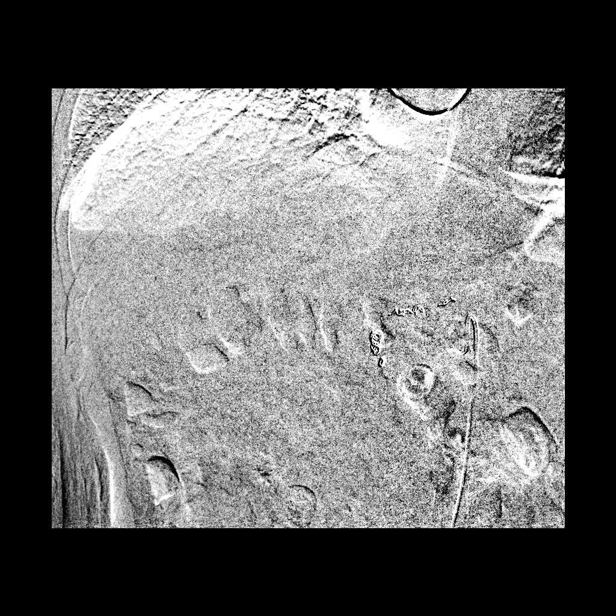
[frame 29/33]
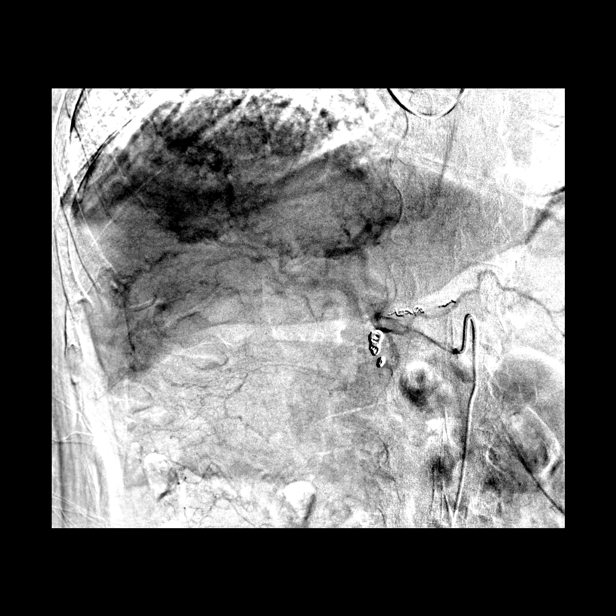

[Series 3: care body 4 · 3 of 16 frames shown (2 of 3)]
[frame 3/16]
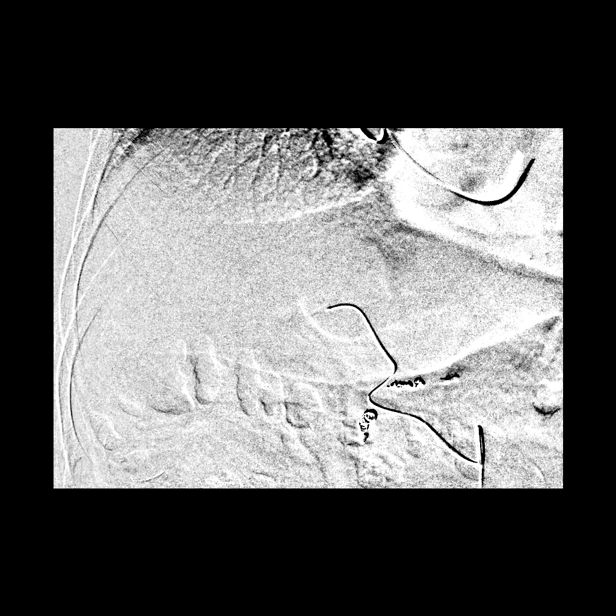
[frame 14/16]
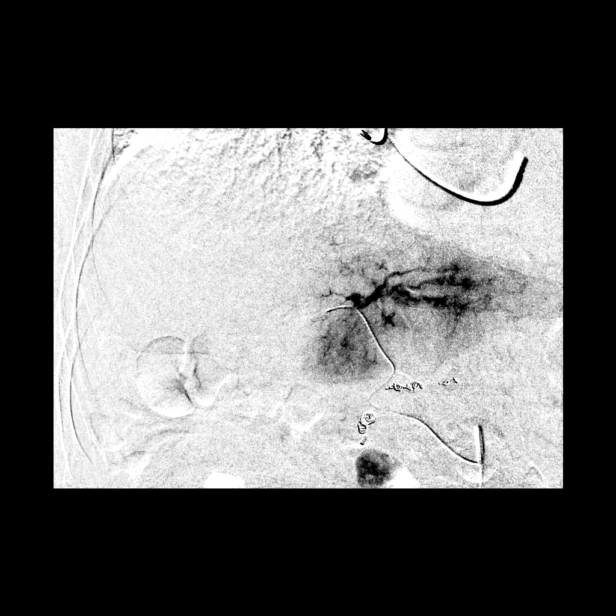
[frame 16/16]
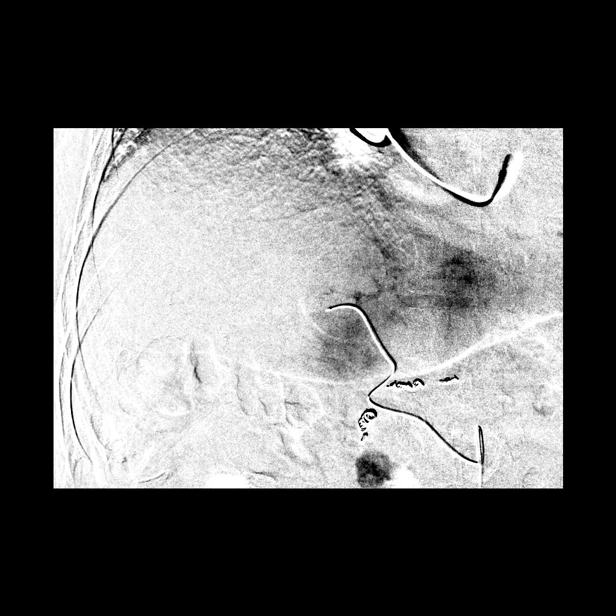

[Series 4: care body 4 · 2 of 47 frames shown (3 of 3)]
[frame 8/47]
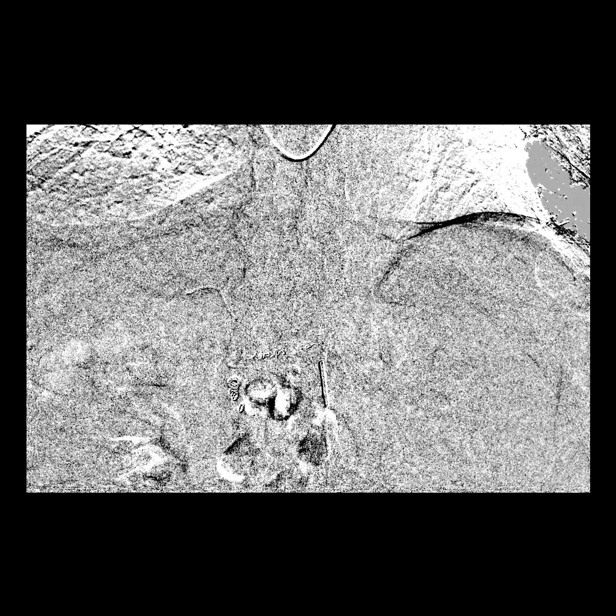
[frame 40/47]
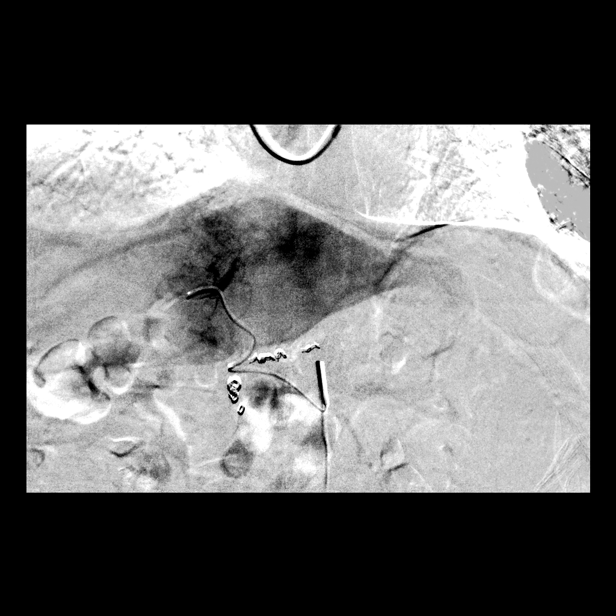

[Series 6: fl - angio · 2 of 136 frames shown]
[frame 69/136]
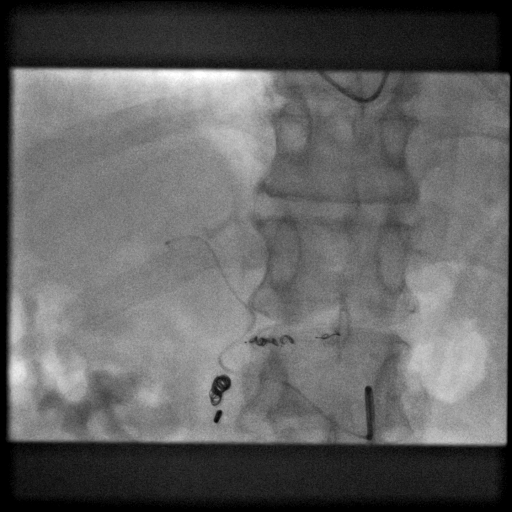
[frame 126/136]
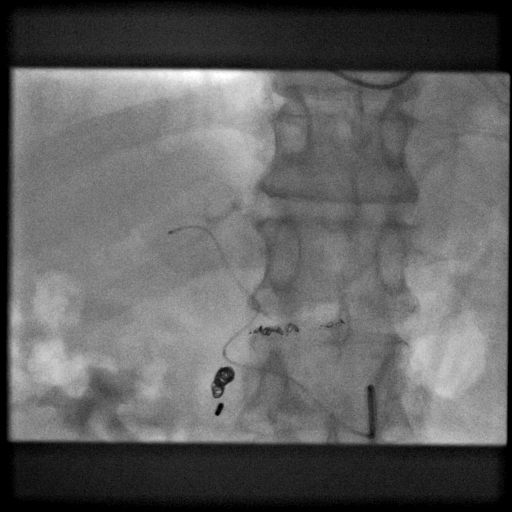

[Series 100: ir embo tumor organ ischemia infarct inc · 2 of 4 slices shown (1 of 2)]
[im 1/4]
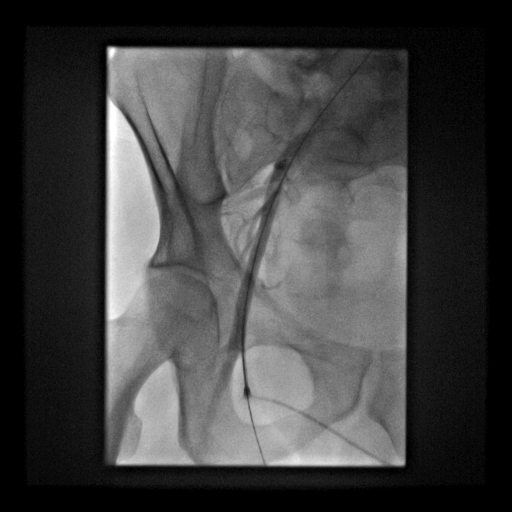
[im 3/4]
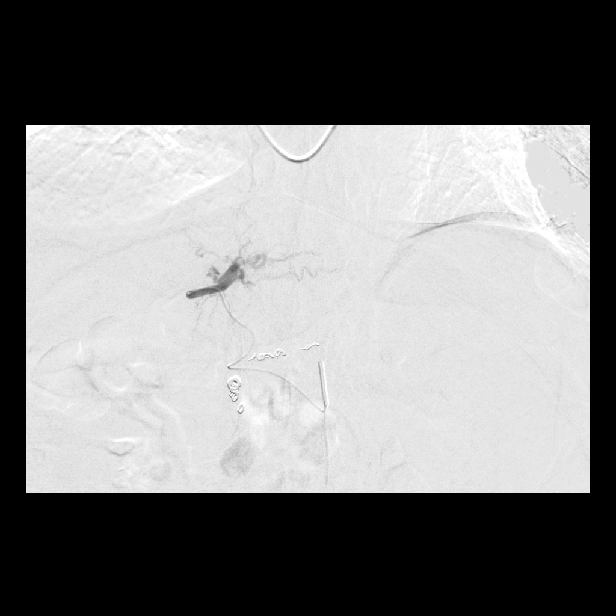

[Series 300: ir embo tumor organ ischemia infarct inc · 2 of 3 slices shown (2 of 2)]
[im 1/3]
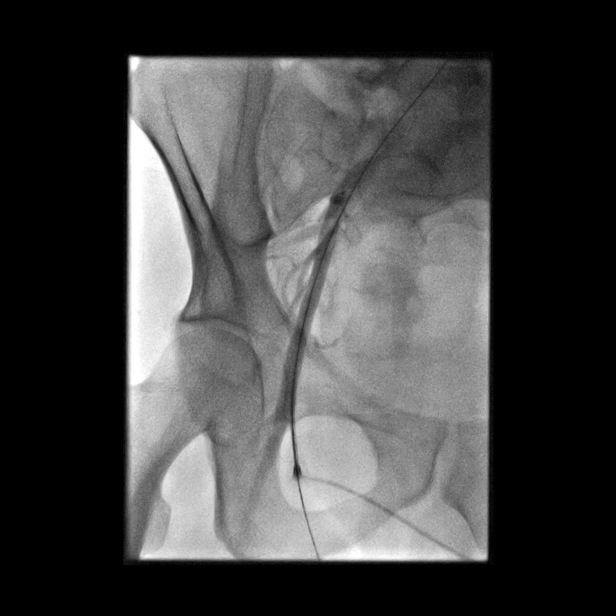
[im 3/3]
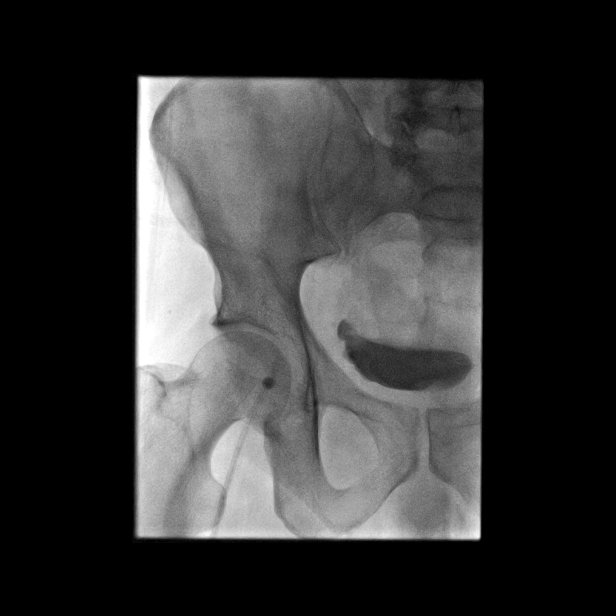

[13 of 23 positions shown; findings below may reference images not displayed]

1.  SUPERIOR MESENTERIC ARTERIOGRAM (2nd order)
2.  LEFT HEPATIC ARTERIOGRAM (3rd order)
3.  TRANSCATHETER [id number] RADIOEMBOLIZATION OF THE LEFT HEPATIC
ARTERY
4.  FLOUROSCOPIC GUIDED ADMINISTRATION OF SIR-SPHERES
5.  ULTRASOUND GUIDED VASCULAR ACCESS OF THE RIGHT COMMON FEMORAL
ARTERY

Comparisons: Stage 1 Y-90 Radioembolization - 05/09/2012;
03/06/2012; Y-90 Radioembolization of the right hepatic artery -
03/13/2022; CTA of the abdomen and pelvis - 01/21/2029

Intravenous Medications: Versed 1 mg IV; Fentanyl 50 mcg IV;
Protonix 40 mg IV; Decadron 20 mg IV; Toradol 30 mg IV; Zofran 4 mg
IV; Zosyn 3.375 mg IV - the antibiotic was administered within an
appropriate time frame prior to the initiation of the procedure.

Contrast: 80 ml Nmnipaque-DJJ

Total Moderate Sedation Time: 70 minutes

Fluoroscopy Time: 6.7 minutes.

Access:  Right common femoral artery; hemostasis achieved with a
Mynx closure device.

Complications: None immediate
The right groin was prepped and draped in the usual sterile
fashion, and a sterile drape was applied covering the operative
field.  Maximum barrier sterile technique with sterile gowns and
gloves were used for the procedure.  A timeout was performed prior
to the initiation of the procedure.  Local anesthesia was provided
with 1% lidocaine.

The right femoral head was marked fluoroscopically.  Under direct
ultrasound guidance, the right common femoral artery was accessed
with a micropuncture kit allowing placement of a of 5-French
vascular sheath. An ultrasound image was saved for documentation
purposes.  A limited arteriogram performed through the side arm of
the sheath confirming appropriate access within the right common
femoral artery.

Over a Benson wire, a Mickelson catheter was advanced to the level
of the inferior thoracic aorta where it was reformed, back bled and
flushed.  The Mickelson catheter was utilized to select the
superior mesenteric artery and a superior mesenteric arteriogram
was performed.

With the use of a Synchro 14 microwire, a high flow renegade
microcatheter was advanced into the left hepatic artery and a left
hepatic arteriogram was performed.

The microcatheter was advanced further into the left hepatic artery
to a location deemed safe for radioembolization. Limited contrast
injection confirmed appropriate positioning and preservation of
antegrade flow.  Radioembolization was then performed of the left
hepatic artery with Bttrium-GM SIR Spheres.  Particles were
administered via a microcatheter utilizing a completely enclosed
system. Monitoring of antegrade flow was performed during and after
the administration under fluoroscopy with use of contrast
intermittently.  After the administration of the particles, the
microcatheter, outer catheter and administration system were then
discarded into radiation safety receptacle.  At this point, the
procedure was terminated.

The right common femoral approach vascular sheath was removed and
hemostasis was achieved with the use of Mynx closure device.  A
dressing was placed.  The patient tolerated procedure well without
immediate postprocedural complication.  All participants involved
with the procedure were surveyed by the radiation safety officer
prior to exiting the fluoroscopy suite.  The patient was escorted
to the nuclear medicine department for post-treatment imaging.
FINDINGS: Superior mesenteric arteriogram redemonstrats known accessory
hepatic arterial supply arising nearly exclusively from the
superior mesenteric artery. Stable sequela of successful prior coil
embolization of GDA and right gastric arteries.

Selective left hepatic arteriogram confirms brisk antegrade flow
and was negative for definitive extrahepatic vascular supply.

Successful [AGE] radioembolization of the left hepatic artery with
intermittent contrast injections during and after the
radioembolization confirming preserved antegrade flow without
reflux.
IMPRESSION: Technically successful radioembolization of the left hepatic artery
with Bttrium-GM microspheres.

PLAN:

This completes the patient's Y-90 radioembolization treatment.  The
patient will be seen in clinic in 1 month to ensure appropriate
recovery.  A follow-up PET CT as well as a CEA level will be
obtained in approximately 3 months.
